# Patient Record
Sex: Male | Born: 1975 | Race: White | Hispanic: No | Marital: Married | Smoking: Never smoker
Health system: Southern US, Community
[De-identification: ages and names within clinical notes are randomized; demographics above are authoritative.]

## PROBLEM LIST (undated history)

## (undated) DIAGNOSIS — I10 Essential (primary) hypertension: Secondary | ICD-10-CM

## (undated) DIAGNOSIS — K219 Gastro-esophageal reflux disease without esophagitis: Secondary | ICD-10-CM

## (undated) DIAGNOSIS — J302 Other seasonal allergic rhinitis: Secondary | ICD-10-CM

## (undated) DIAGNOSIS — Z87442 Personal history of urinary calculi: Secondary | ICD-10-CM

## (undated) DIAGNOSIS — G4730 Sleep apnea, unspecified: Secondary | ICD-10-CM

## (undated) HISTORY — PX: CHOLECYSTECTOMY: SHX55

## (undated) HISTORY — DX: Essential (primary) hypertension: I10

---

## 2010-07-10 ENCOUNTER — Encounter
Admission: RE | Admit: 2010-07-10 | Discharge: 2010-07-10 | Payer: Self-pay | Source: Home / Self Care | Attending: Orthopedic Surgery | Admitting: Orthopedic Surgery

## 2011-06-27 IMAGING — RF DG FLUORO GUIDE NDL PLC/BX
9 series · 9 of 9 positions shown · IV contrast (multihance)
Comparison: none

CLINICAL DATA: Pain.  Assess for scapholunate ligament tear.

Fluoroscopy Time: 59 seconds
LEFT RADIOCARPAL JOINT INJECTION UNDER FLUOROSCOPY
TECHNIQUE: The skin overlying the left dorsal wrist was cleansed
with Betadine, draped in the usual sterile fashion, and infiltrated
locally with 1% Lidocaine.  A 25 gauge needle was advanced to the
joint on one pass under intermittent fluoroscopy.    Threeml of a
mixture of 0.1 ml Multihance 20 ml of dilute Maruchy 60 was then
used to fill the radiocarpal joint.  No apparent complication.  The
patient was taken immediately to MR.  Limited filming shows
contrast restricted to the radiocarpal joint.

[Series 1: (hospital) · 1 of 1 slices shown (1 of 9)]
[im 1/1]
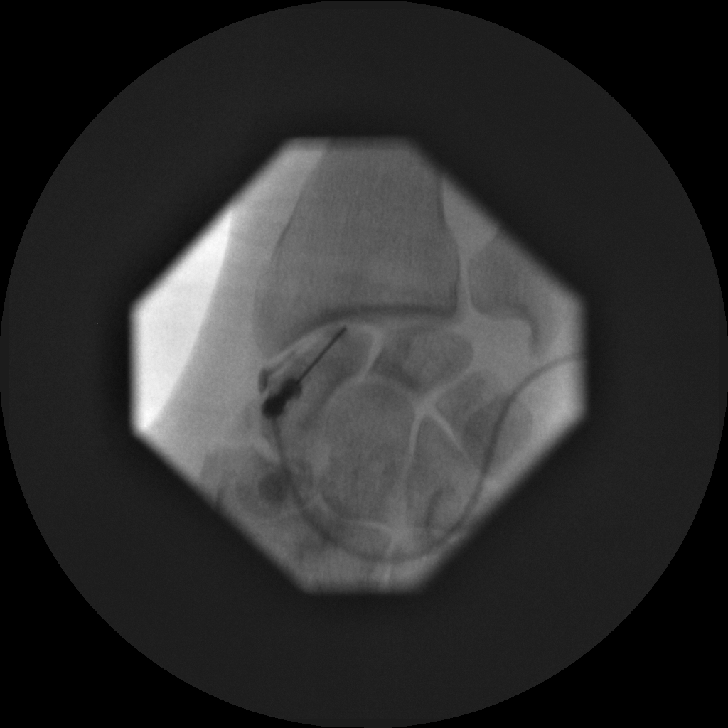

[Series 2: (hospital) · 1 of 1 slices shown (2 of 9)]
[im 1/1]
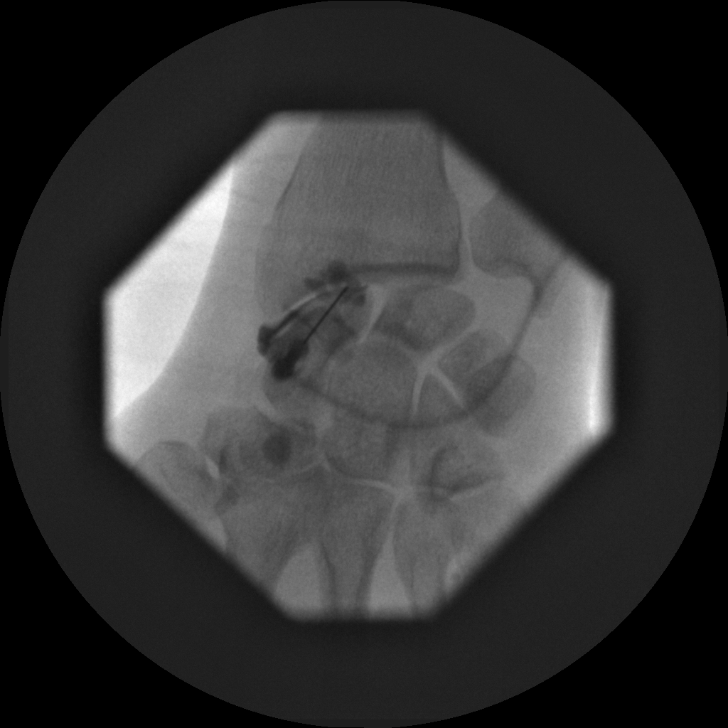

[Series 3: (hospital) · 1 of 1 slices shown (3 of 9)]
[im 1/1]
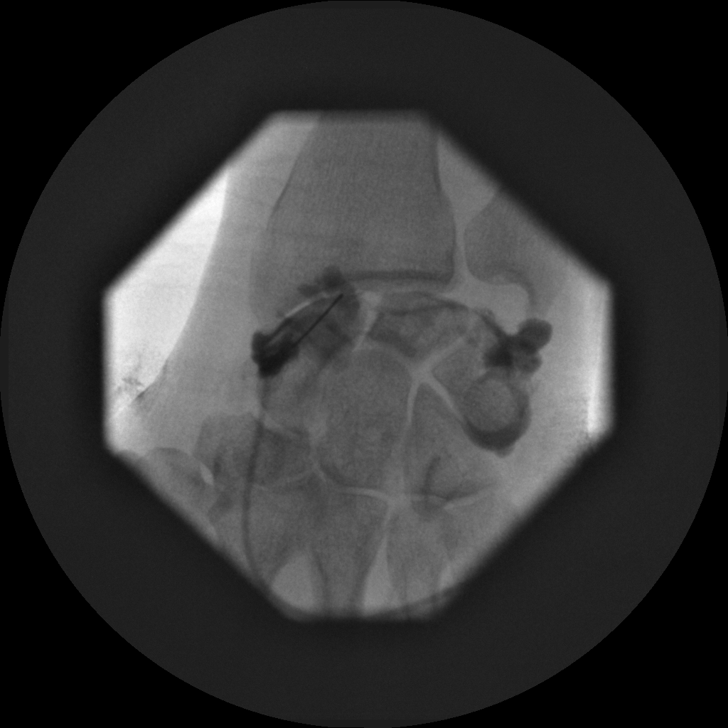

[Series 4: (hospital) · 1 of 1 slices shown (4 of 9)]
[im 1/1]
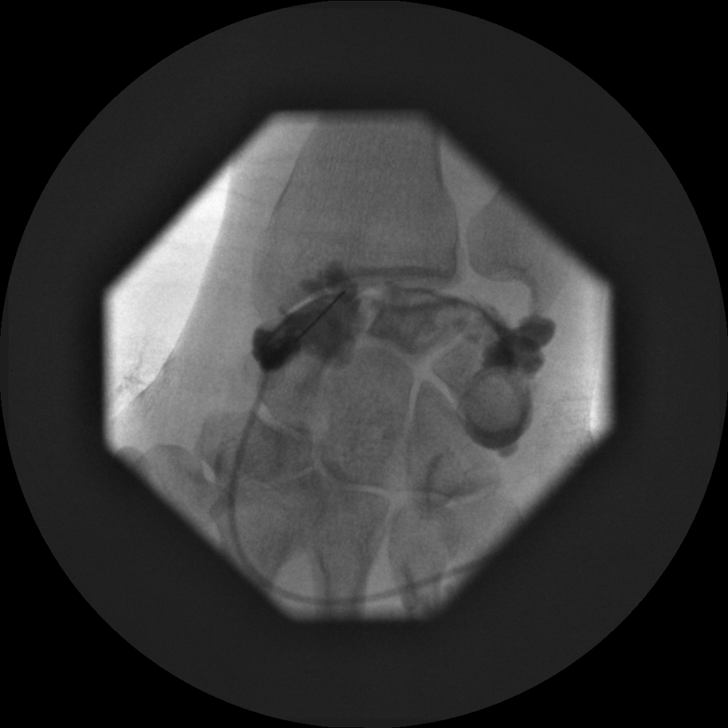

[Series 5: (hospital) · 1 of 1 slices shown (5 of 9)]
[im 1/1]
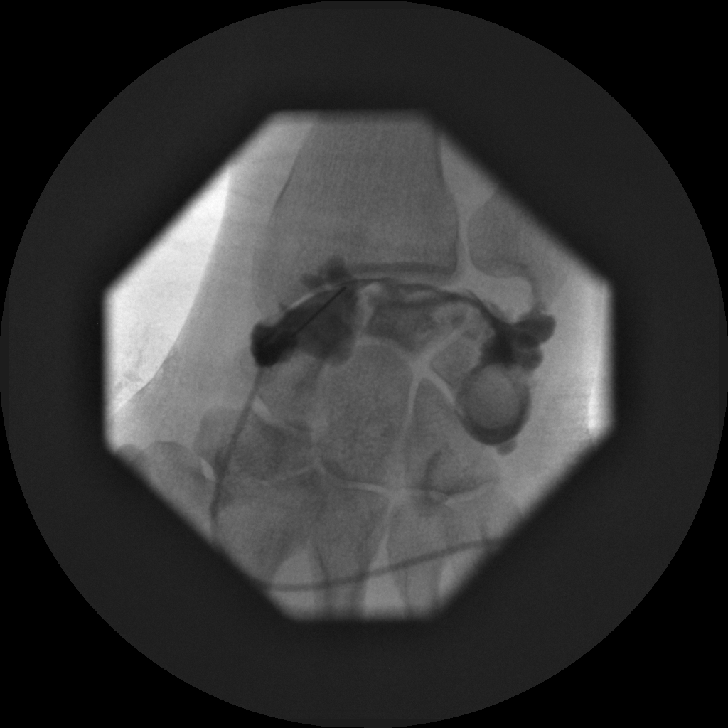

[Series 6: (hospital) · 1 of 1 slices shown (6 of 9)]
[im 1/1]
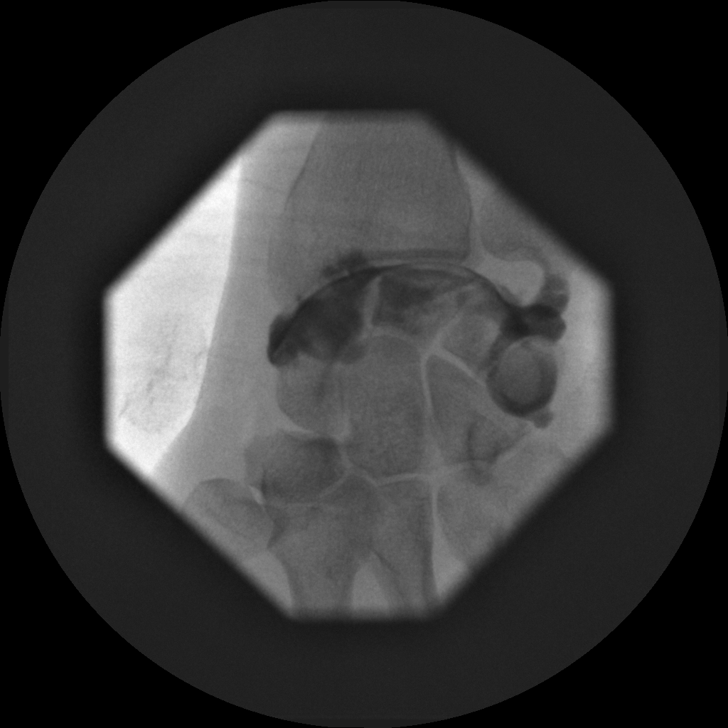

[Series 7: (hospital) · 1 of 1 slices shown (7 of 9)]
[im 1/1]
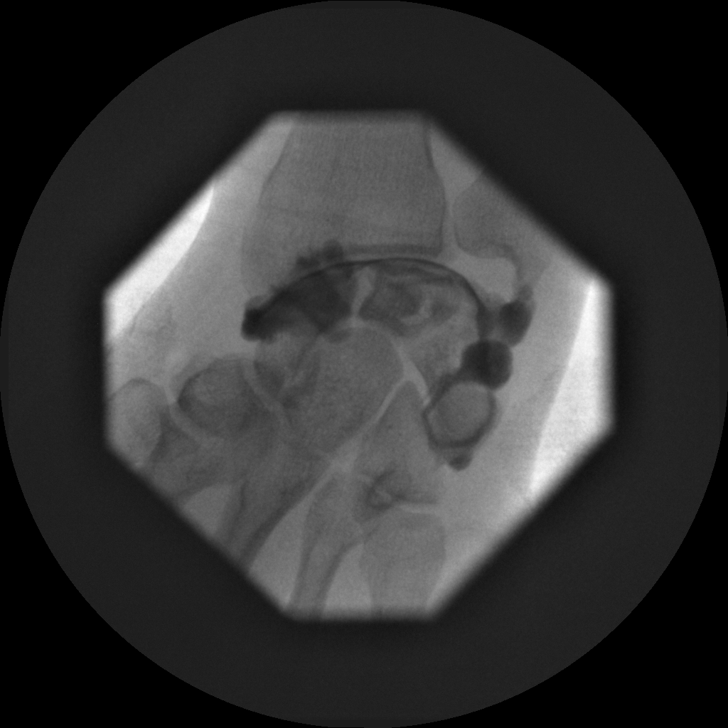

[Series 8: (hospital) · 1 of 1 slices shown (8 of 9)]
[im 1/1]
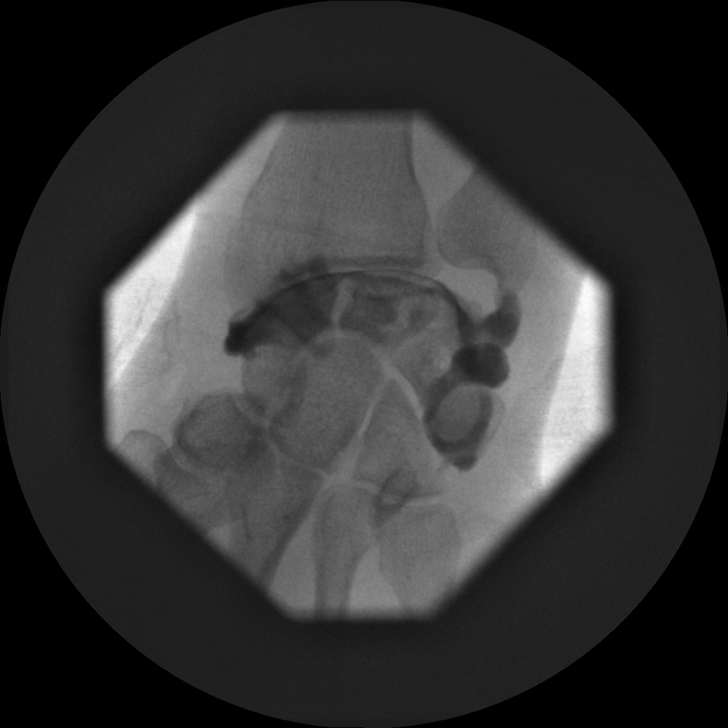

[Series 9: (hospital) · 1 of 1 slices shown (9 of 9)]
[im 1/1]
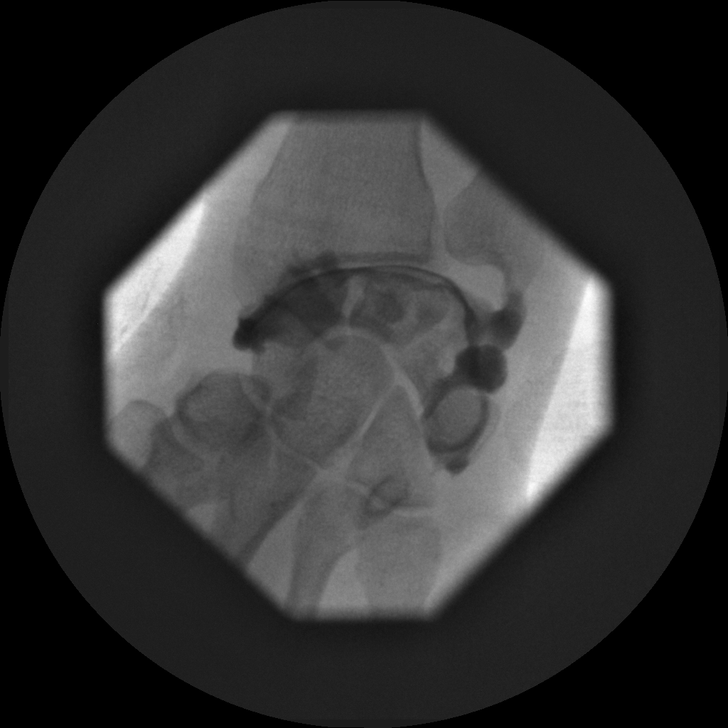

[9 of 9 positions shown; findings below may reference images not displayed]

IMPRESSION: Technically successful left radiocarpal injection for MRI.

## 2014-07-23 HISTORY — PX: CHOLECYSTECTOMY: SHX55

## 2018-06-25 ENCOUNTER — Ambulatory Visit (INDEPENDENT_AMBULATORY_CARE_PROVIDER_SITE_OTHER): Payer: 59 | Admitting: Internal Medicine

## 2018-06-25 ENCOUNTER — Encounter (INDEPENDENT_AMBULATORY_CARE_PROVIDER_SITE_OTHER): Payer: Self-pay | Admitting: Internal Medicine

## 2018-06-25 ENCOUNTER — Telehealth (INDEPENDENT_AMBULATORY_CARE_PROVIDER_SITE_OTHER): Payer: Self-pay | Admitting: *Deleted

## 2018-06-25 ENCOUNTER — Encounter (INDEPENDENT_AMBULATORY_CARE_PROVIDER_SITE_OTHER): Payer: Self-pay | Admitting: *Deleted

## 2018-06-25 VITALS — BP 154/82 | HR 84 | Temp 97.5°F | Ht 70.0 in | Wt 213.5 lb

## 2018-06-25 DIAGNOSIS — R195 Other fecal abnormalities: Secondary | ICD-10-CM

## 2018-06-25 DIAGNOSIS — R197 Diarrhea, unspecified: Secondary | ICD-10-CM

## 2018-06-25 DIAGNOSIS — I10 Essential (primary) hypertension: Secondary | ICD-10-CM | POA: Insufficient documentation

## 2018-06-25 NOTE — Telephone Encounter (Signed)
Patient needs suprep 

## 2018-06-25 NOTE — Progress Notes (Signed)
   Subjective:    Patient ID: Brendan Armstrong, male    DOB: 09-13-1975, 42 y.o.   MRN: [id number]  HPI Referred by Blima DessertJo Ann Earp FNP for change in bowel movements. He has urgency. BMs are loose to watery. No blood or mucous.  No fever.  Has tried Imodium which helped initially. He has noticed when he eats red meat he has to go to the BR before he finishes. Has not had eaten red meat since Saturday. The urgency is not quite as bad since he has had not had the urgency. He has BMs multiple times during the day. For the past 4-6 weeks, he will have 2-3 BMs before he goes to work.  He feels sick to his stomach but does not have the urge to throw up. Wife states he feels tired. He has had symptoms for about 6 weeks.  Before this he would have 2-3 stools a day and were loose but not constant. He says the majority of the time, his stools were the right consistency. No recent antibiotics. He has not removed any ticks that he know of.  No family hx of colon cancer. Wife would also like to proceed with a colonoscopy.   Ova and Para with Giardia 06/18/2018: Not detected. Shiga toxin, campylobacter, salmonella and shigella: negative.   06/11/2018 H and H 14.5 and 41.5  Works for [employer].   Review of Systems Past Medical History:  Diagnosis Date  . Hypertension       Allergies  Allergen Reactions  . Avelox [Moxifloxacin Hcl In Nacl]     Rash,hives    Current Outpatient Medications on File Prior to Visit  Medication Sig Dispense Refill  . cetirizine (ZYRTEC) 10 MG tablet Take 10 mg by mouth daily.    . Cholecalciferol (VITAMIN D3) 10 MCG (400 UNIT) CHEW Chew 1,000 Units by mouth.    Marland Kitchen. lisinopril (PRINIVIL,ZESTRIL) 20 MG tablet Take 20 mg by mouth daily. Takes 20mg  one day and then 10mg  the following day. Alternates    . montelukast (SINGULAIR) 10 MG tablet Take 10 mg by mouth at bedtime.    Marland Kitchen. omeprazole (PRILOSEC) 20 MG capsule Take 20 mg by mouth daily.    . vitamin C  (ASCORBIC ACID) 500 MG tablet Take 500 mg by mouth daily.     No current facility-administered medications on file prior to visit.         Objective:   Physical Exam Blood pressure (!) 154/82, pulse 84, temperature (!) 97.5 F (36.4 C), height 5\' 10"  (1.778 m), weight 213 lb 8 oz (96.8 kg). Alert and oriented. Skin warm and dry. Oral mucosa is moist.   . Sclera anicteric, conjunctivae is pink. Thyroid not enlarged. No cervical lymphadenopathy. Lungs clear. Heart regular rate and rhythm.  Abdomen is soft. Bowel sounds are positive. No hepatomegaly. No abdominal masses felt. No tenderness.  No edema to lower extremities.          Assessment & Plan:  Change in stool. Will set him up for a colonoscopy. Will get a GI pathogen. C diff not done on stool studies.

## 2018-06-25 NOTE — Patient Instructions (Addendum)
Colonoscopy. GI pathogen.  Imodium one in am and one on pm

## 2018-06-26 MED ORDER — SUPREP BOWEL PREP KIT 17.5-3.13-1.6 GM/177ML PO SOLN: 1.0000 | Freq: Once | ORAL | 0 refills | Status: AC

## 2018-07-02 ENCOUNTER — Telehealth (INDEPENDENT_AMBULATORY_CARE_PROVIDER_SITE_OTHER): Payer: Self-pay | Admitting: Internal Medicine

## 2018-07-02 LAB — GASTROINTESTINAL PATHOGEN PANEL PCR
C. DIFFICILE TOX A/B, PCR: NOT DETECTED
Campylobacter, PCR: NOT DETECTED
Cryptosporidium, PCR: NOT DETECTED
E COLI (ETEC) LT/ST, PCR: NOT DETECTED
E coli (STEC) stx1/stx2, PCR: NOT DETECTED
E coli 0157, PCR: NOT DETECTED
GIARDIA LAMBLIA, PCR: NOT DETECTED
Norovirus, PCR: NOT DETECTED
Rotavirus A, PCR: NOT DETECTED
SHIGELLA, PCR: NOT DETECTED
Salmonella, PCR: NOT DETECTED

## 2018-07-02 NOTE — Telephone Encounter (Signed)
Wife called wanted to know if you got test results back - please call [phone]

## 2018-07-02 NOTE — Telephone Encounter (Signed)
Message left on phone that stool studies are not back.

## 2018-07-17 NOTE — Telephone Encounter (Signed)
Results left on answering machine. Scheduled for a colonoscopy

## 2018-08-27 ENCOUNTER — Encounter (HOSPITAL_COMMUNITY)
Admission: RE | Disposition: A | Discharge status: Home / Self Care | Payer: Self-pay | Source: Home / Self Care | Attending: Internal Medicine

## 2018-08-27 ENCOUNTER — Other Ambulatory Visit: Payer: Self-pay

## 2018-08-27 ENCOUNTER — Encounter (HOSPITAL_COMMUNITY): Payer: Self-pay | Admitting: *Deleted

## 2018-08-27 ENCOUNTER — Ambulatory Visit (HOSPITAL_COMMUNITY)
Admission: RE | Admit: 2018-08-27 | Discharge: 2018-08-27 | Disposition: A | Discharge status: Home / Self Care | Payer: Commercial Managed Care - PPO | Attending: Internal Medicine | Admitting: Internal Medicine

## 2018-08-27 DIAGNOSIS — Z79899 Other long term (current) drug therapy: Secondary | ICD-10-CM | POA: Diagnosis not present

## 2018-08-27 DIAGNOSIS — R197 Diarrhea, unspecified: Secondary | ICD-10-CM | POA: Insufficient documentation

## 2018-08-27 DIAGNOSIS — K648 Other hemorrhoids: Secondary | ICD-10-CM

## 2018-08-27 DIAGNOSIS — I10 Essential (primary) hypertension: Secondary | ICD-10-CM | POA: Diagnosis not present

## 2018-08-27 DIAGNOSIS — K219 Gastro-esophageal reflux disease without esophagitis: Secondary | ICD-10-CM | POA: Insufficient documentation

## 2018-08-27 HISTORY — DX: Gastro-esophageal reflux disease without esophagitis: K21.9

## 2018-08-27 HISTORY — DX: Other seasonal allergic rhinitis: J30.2

## 2018-08-27 HISTORY — PX: COLONOSCOPY: SHX5424

## 2018-08-27 SURGERY — COLONOSCOPY
Anesthesia: Moderate Sedation

## 2018-08-27 MED ORDER — MEPERIDINE HCL 50 MG/ML IJ SOLN
INTRAMUSCULAR | Status: AC
  Filled 2018-08-27: qty 1

## 2018-08-27 MED ORDER — SODIUM CHLORIDE 0.9 % IV SOLN
INTRAVENOUS | Status: DC
  Administered 2018-08-27: 14:00:00 via INTRAVENOUS

## 2018-08-27 MED ORDER — MIDAZOLAM HCL 5 MG/5ML IJ SOLN
INTRAMUSCULAR | Status: AC
  Filled 2018-08-27: qty 10

## 2018-08-27 MED ORDER — MIDAZOLAM HCL 5 MG/5ML IJ SOLN
INTRAMUSCULAR | Status: DC | PRN
  Administered 2018-08-27: 1 mg via INTRAVENOUS
  Administered 2018-08-27 (×3): 2 mg via INTRAVENOUS
  Administered 2018-08-27: 1 mg via INTRAVENOUS

## 2018-08-27 MED ORDER — DICYCLOMINE HCL 10 MG PO CAPS: 10.0000 mg | ORAL_CAPSULE | Freq: Three times a day (TID) | ORAL | 5 refills | Status: DC

## 2018-08-27 MED ORDER — MEPERIDINE HCL 50 MG/ML IJ SOLN
INTRAMUSCULAR | Status: DC | PRN
  Administered 2018-08-27: 25 mg via INTRAVENOUS
  Administered 2018-08-27: 25 mg

## 2018-08-27 NOTE — OR Nursing (Signed)
To Whom it May Concern:  Brendan BandaJason Armstrong underwent a procedure on 08/27/2018 in which he was sedated.  He is unable to drive or work until 6/0/45402/01/2019.  Thank you    Jenesis Suchy B. Mathis FareAlbert, rn

## 2018-08-27 NOTE — H&P (Addendum)
Brendan Armstrong is an 43 y.o. male.   Chief Complaint: Patient is here for colonoscopy. HPI: Patient is 43 year old Caucasian male who presents with 73-month history of diarrhea with urgency.  No history of fever abdominal pain rectal bleeding or weight loss.  Stool studies were negative including GI pathogen panel.  Family history is negative for IBD or CRC.   He is here for diagnostic colonoscopy.  Past Medical History:  Diagnosis Date  . GERD (gastroesophageal reflux disease)   . Hypertension   . Seasonal allergies     Past Surgical History:  Procedure Laterality Date  . CHOLECYSTECTOMY     cholecystitis    Family History  Problem Relation Age of Onset  . Colon cancer Neg Hx    Social History:  reports that he has never smoked. He has never used smokeless tobacco. He reports that he does not drink alcohol or use drugs.  Allergies:  Allergies  Allergen Reactions  . Avelox [Moxifloxacin Hcl In Nacl] Hives and Rash    Medications Prior to Admission  Medication Sig Dispense Refill  . cholecalciferol (VITAMIN D) 25 MCG (1000 UT) tablet Take 1,000 Units by mouth daily.    Marland Kitchen ibuprofen (ADVIL,MOTRIN) 200 MG tablet Take 600 mg by mouth every 8 (eight) hours as needed (pain.).    Marland Kitchen lisinopril (PRINIVIL,ZESTRIL) 20 MG tablet Take 10-20 mg by mouth See admin instructions. Take 1 tablet (20 mg) by mouth every other day, then take 0.5 tablet (10 mg) by mouth every other day (alternating cyclically)    . montelukast (SINGULAIR) 10 MG tablet Take 10 mg by mouth daily.     Marland Kitchen omeprazole (PRILOSEC) 20 MG capsule Take 20 mg by mouth daily before breakfast.     . vitamin C (ASCORBIC ACID) 500 MG tablet Take 500 mg by mouth daily.      No results found for this or any previous visit (from the past 48 hour(s)). No results found.  ROS  Blood pressure 119/85, pulse 99, temperature 98.2 F (36.8 C), temperature source Oral, resp. rate 14, height 5\' 10"  (1.778 m), weight 93 kg, SpO2 98 %. Physical  Exam  Constitutional: He appears well-developed and well-nourished.  HENT:  Mouth/Throat: Oropharynx is clear and moist.  Eyes: Conjunctivae are normal. No scleral icterus.  Neck: No thyromegaly present.  Cardiovascular: Normal rate, regular rhythm and normal heart sounds.  No murmur heard. Respiratory: Effort normal and breath sounds normal.  GI: Soft. He exhibits no distension and no mass. There is no abdominal tenderness.  Musculoskeletal:        General: No edema.  Neurological: He is alert.  Skin: Skin is warm and dry.     Assessment/Plan 25-month history of diarrhea and negative stool studies. Diagnostic colonoscopy.  Lionel December, MD 08/27/2018, 2:21 PM

## 2018-08-27 NOTE — Discharge Instructions (Signed)
No aspirin or NSAIDs for 24 hours. Resume usual medications as before. Dicyclomine 10 mg by mouth 30 minutes before each meal daily. Resume usual diet. No driving for 24 hours. Physician will call with biopsy results and further recommendations.   Colonoscopy, Adult, Care After This sheet gives you information about how to care for yourself after your procedure. Your health care provider may also give you more specific instructions. If you have problems or questions, contact your health care provider. Dr Karilyn Cota:  [phone].  After hours and weekends call the hospital and have the GI doctor on call paged; they will call you back.  What can I expect after the procedure? After the procedure, it is common to have:  A small amount of blood in your stool for 24 hours after the procedure.  Some gas.  Mild abdominal cramping or bloating. Follow these instructions at home: General instructions  For the first 24 hours after the procedure: ? Do not drive or use machinery. ? Do not sign important documents. ? Do not drink alcohol. ? Do your regular daily activities at a slower pace than normal.  Take over-the-counter or prescription medicines only as told by your health care provider. Relieving cramping and bloating   Try walking around when you have cramps or feel bloated.  Eating and drinking   Drink enough fluid to keep your urine pale yellow.  Resume your normal diet as instructed by your health care provider. Contact a health care provider if:  You have blood in your stool 2-3 days after the procedure. Get help right away if:  You have more than a small spotting of blood in your stool.  You pass large blood clots in your stool.  Your abdomen is swollen.  You have nausea or vomiting.  You have a fever.  You have increasing abdominal pain that is not relieved with medicine. Summary  After the procedure, it is common to have a small amount of blood in your stool.  You may also have mild abdominal cramping and bloating.  For the first 24 hours after the procedure, do not drive or use machinery, sign important documents, or drink alcohol.  Contact your health care provider if you have a lot of blood in your stool, nausea or vomiting, a fever, or increased abdominal pain. This information is not intended to replace advice given to you by your health care provider. Make sure you discuss any questions you have with your health care provider. Document Released: 02/21/2004 Document Revised: 05/01/2017 Document Reviewed: 09/20/2015 Elsevier Interactive Patient Education  2019 [employer].

## 2018-08-27 NOTE — Op Note (Signed)
[name] Patient Name: Brendan BandaJason Armstrong Procedure Date: 08/27/2018 2:01 PM MRN: [id number] Date of Birth: Nov 28, 1975 Attending MD: Lionel DecemberNajeeb Chett Taniguchi , MD CSN: [id number] Age: 43 Admit Type: Outpatient Procedure:                Colonoscopy Indications:              Clinically significant diarrhea of unexplained                            origin Providers:                Lionel DecemberNajeeb Purity Irmen, MD, Jannett CelestineAnitra Bell, RN, Dyann Ruddleonya Wilson Referring MD:             Blima DessertJo Ann Earp, FNP Medicines:                Meperidine 50 mg IV, Midazolam 8 mg IV Complications:            No immediate complications. Estimated Blood Loss:     Estimated blood loss was minimal. Procedure:                Pre-Anesthesia Assessment:                           - Prior to the procedure, a History and Physical                            was performed, and patient medications and                            allergies were reviewed. The patient's tolerance of                            previous anesthesia was also reviewed. The risks                            and benefits of the procedure and the sedation                            options and risks were discussed with the patient.                            All questions were answered, and informed consent                            was obtained. Prior Anticoagulants: The patient has                            taken no previous anticoagulant or antiplatelet                            agents. ASA Grade Assessment: II - A patient with                            mild systemic disease. After reviewing the risks  and benefits, the patient was deemed in                            satisfactory condition to undergo the procedure.                           After obtaining informed consent, the colonoscope                            was passed under direct vision. Throughout the                            procedure, the patient's blood pressure, pulse, and                             oxygen saturations were monitored continuously. The                            CF-[id number] ([id number]) scope was introduced through                            the anus and advanced to the the cecum, identified                            by appendiceal orifice and ileocecal valve. The                            colonoscopy was performed without difficulty. The                            patient tolerated the procedure well. The quality                            of the bowel preparation was good. The ileocecal                            valve, appendiceal orifice, and rectum were                            photographed. Scope In: 2:31:36 PM Scope Out: 2:50:00 PM Scope Withdrawal Time: 0 hours 9 minutes 42 seconds  Total Procedure Duration: 0 hours 18 minutes 24 seconds  Findings:      The perianal and digital rectal examinations were normal.      The colon (entire examined portion) appeared normal. Random biopsies       were taken from sigmoid with a cold forceps for histology.      Internal hemorrhoids were found during retroflexion. The hemorrhoids       were small. Impression:               - The entire examined colon is normal. Biopsied.                           - Internal hemorrhoids. Moderate Sedation:      Moderate (conscious) sedation was administered by the endoscopy  nurse       and supervised by the endoscopist. The following parameters were       monitored: oxygen saturation, heart rate, blood pressure, CO2       capnography and response to care. Total physician intraservice time was       25 minutes. Recommendation:           - Patient has a contact number available for                            emergencies. The signs and symptoms of potential                            delayed complications were discussed with the                            patient. Return to normal activities tomorrow.                            Written discharge instructions were provided to  the                            patient.                           - Resume previous diet today.                           - Continue present medications.                           - Use Bentyl (dicyclomine) 10 mg PO TID 30 min AC.                           - Await pathology results.                           - Repeat colonoscopy at age 43 for screening                            purposes. Procedure Code(s):        --- Professional ---                           [id number], Colonoscopy, flexible; with biopsy, single                            or multiple                           99153, Moderate sedation; each additional 15                            minutes intraservice time                           G0500, Moderate sedation services provided by the  same physician or other qualified health care                            professional performing a gastrointestinal                            endoscopic service that sedation supports,                            requiring the presence of an independent trained                            observer to assist in the monitoring of the                            patient's level of consciousness and physiological                            status; initial 15 minutes of intra-service time;                            patient age 50 years or older (additional time may                            be reported with 16109, as appropriate) Diagnosis Code(s):        --- Professional ---                           K64.8, Other hemorrhoids                           R19.7, Diarrhea, unspecified CPT copyright 2018 American Medical Association. All rights reserved. The codes documented in this report are preliminary and upon coder review may  be revised to meet current compliance requirements. Lionel December, MD Lionel December, MD 08/27/2018 3:00:23 PM This report has been signed electronically. Number of Addenda: 0

## 2018-09-03 ENCOUNTER — Encounter (HOSPITAL_COMMUNITY): Payer: Self-pay | Admitting: Internal Medicine

## 2019-03-02 ENCOUNTER — Telehealth (INDEPENDENT_AMBULATORY_CARE_PROVIDER_SITE_OTHER): Payer: Self-pay | Admitting: Internal Medicine

## 2019-03-02 NOTE — Telephone Encounter (Signed)
Patients wife called stated Dr Laural Golden had prescribed a medication but it is not working - wants to know if there is anything else he can try - ph# for Claiborne Billings (spouse) [phone]

## 2019-03-04 NOTE — Telephone Encounter (Signed)
Talked with Claiborne Billings , patient's wife. She is asking if there is another medication that he can take instead of the Dicyclomine. His life style is so busy the patient forgets to take it before meals.  He was able to take it at first as prescribed , and told wife he could not tell a difference in his stomach. Wife says he can't make it unless he takes Imodium. His diarrhea is so bad after he eats. Patient has got to where he is afraid to eat.  She says if he needs to come back in for another visit to just let her know.  [phone].

## 2019-03-09 NOTE — Telephone Encounter (Signed)
Forwarded to Dr.Rehman. 

## 2019-03-11 NOTE — Telephone Encounter (Signed)
Patients wife called back wanting to know about patients medication - ph# [phone]

## 2019-03-11 NOTE — Telephone Encounter (Signed)
I have sent Dr.Rehman a message.

## 2019-03-11 NOTE — Telephone Encounter (Signed)
Per Dr.Rehman  Patient is to stop the Dicyclomine. He may take the Imodium 2 mg by mouth 3 times daily. We need to get a Celiac Antibody Panel and ask the patient to keep a stool diary for 2 weeks. He will keep a note as if the stool was liquid and small , liquid and large , ect. Describe it as it appears each time. He will be given a 2 week appointment and bring the stool diary in with him.  Patient was called and a message was left with Dr.Rehman's recommendation. Ask Claiborne Billings to call with the lab order should go to in the morning.

## 2019-03-11 NOTE — Telephone Encounter (Signed)
addressed

## 2019-03-16 ENCOUNTER — Other Ambulatory Visit (INDEPENDENT_AMBULATORY_CARE_PROVIDER_SITE_OTHER): Payer: Self-pay | Admitting: *Deleted

## 2019-03-16 DIAGNOSIS — R197 Diarrhea, unspecified: Secondary | ICD-10-CM

## 2019-03-23 ENCOUNTER — Telehealth (INDEPENDENT_AMBULATORY_CARE_PROVIDER_SITE_OTHER): Payer: Self-pay | Admitting: Internal Medicine

## 2019-03-23 NOTE — Telephone Encounter (Signed)
Celiac antibody panel is negative. Patient called and message last results left on his answering service. I asked patient to call office with progress report. If he is still having diarrhea would arrange for office visit.

## 2021-05-05 ENCOUNTER — Ambulatory Visit: Payer: 59 | Admitting: Cardiovascular Disease

## 2021-06-20 ENCOUNTER — Ambulatory Visit: Payer: 59 | Admitting: Cardiovascular Disease

## 2021-07-13 ENCOUNTER — Other Ambulatory Visit: Payer: Self-pay

## 2021-07-13 ENCOUNTER — Ambulatory Visit (INDEPENDENT_AMBULATORY_CARE_PROVIDER_SITE_OTHER): Payer: BC Managed Care – PPO | Admitting: Cardiovascular Disease

## 2021-07-13 ENCOUNTER — Encounter: Payer: Self-pay | Admitting: Cardiovascular Disease

## 2021-07-13 ENCOUNTER — Ambulatory Visit (INDEPENDENT_AMBULATORY_CARE_PROVIDER_SITE_OTHER): Payer: 59

## 2021-07-13 VITALS — BP 144/91 | HR 89 | Ht 70.0 in | Wt 220.0 lb

## 2021-07-13 DIAGNOSIS — I10 Essential (primary) hypertension: Secondary | ICD-10-CM

## 2021-07-13 DIAGNOSIS — R002 Palpitations: Secondary | ICD-10-CM

## 2021-07-13 MED ORDER — LISINOPRIL 20 MG PO TABS: 20.0000 mg | ORAL_TABLET | Freq: Every day | ORAL | 2 refills | Status: DC

## 2021-07-13 NOTE — Patient Instructions (Signed)
Medication Instructions:  Your physician has recommended you make the following change in your medication:   INCREASE Lisinopril to 20 mg daily. An Rx has been sent to your pharmacy.   *If you need a refill on your cardiac medications before your next appointment, please call your pharmacy*   Lab Work: None ordered  If you have labs (blood work) drawn today and your tests are completely normal, you will receive your results only by: MyChart Message (if you have MyChart) OR A paper copy in the mail If you have any lab test that is abnormal or we need to change your treatment, we will call you to review the results.   Testing/Procedures: Your provider has ordered a heart monitor to wear for 14 days. This will be mailed to your home with instructions on placement. Once you have finished the time frame requested, you will return monitor in box provided.      Follow-Up: At [facility], you and your health needs are our priority.  As part of our continuing mission to provide you with exceptional heart care, we have created designated Provider Care Teams.  These Care Teams include your primary Cardiologist (physician) and Advanced Practice Providers (APPs -  Physician Assistants and Nurse Practitioners) who all work together to provide you with the care you need, when you need it.  We recommend signing up for the patient portal called "MyChart".  Sign up information is provided on this After Visit Summary.  MyChart is used to connect with patients for Virtual Visits (Telemedicine).  Patients are able to view lab/test results, encounter notes, upcoming appointments, etc.  Non-urgent messages can be sent to your provider as well.   To learn more about what you can do with MyChart, go to [email or web address].    Your next appointment:   As needed  The format for your next appointment:   In Person  Provider:   You may see Lorine Bears, MD or one of the following Advanced Practice  Providers on your designated Care Team:   Nicolasa Ducking, NP Eula Listen, PA-C Cadence Fransico Michael, PA-C{   Other Instructions N/A

## 2021-07-13 NOTE — Progress Notes (Signed)
Cardiology Office Note   Date:  07/13/2021   ID:  Brendan Armstrong, DOB Apr 22, 1976, MRN [id number]  PCP:  Blima Dessert, FNP  Cardiologist:   Lorine Bears, MD   Chief Complaint  Patient presents with   Other    BP c/o dizziness, edema hands, feet and ankles. Meds reviewed verbally with pt.      History of Present Illness: Brendan Armstrong is a 45 y.o. male who was referred by Blima Dessert for evaluation of dizziness and palpitations.  The patient has no past cardiac history.  He has known history of essential hypertension GERD and obesity.  He was diagnosed with hypertension more than 10 years ago and was started on small dose lisinopril with reasonable control.  He is not a smoker and does not drink alcohol.  He does admit excessive amount of caffeine.  There is family history of heart disease but the specifics are not available. Recently, he had intermittent swelling in his hands but that has resolved.  In addition, he does complain of swelling in his feet at the end of a long working day that usually resolves by the next morning.  He had issues with orthostatic dizziness and intermittent palpitations associated with dizziness.  No chest pain or shortness of breath. He does snore at night with episodes of apnea noted by his wife.  He will be tested for sleep apnea in the near future.  During episodes of dizziness, his blood pressure is usually elevated.   Past Medical History:  Diagnosis Date   GERD (gastroesophageal reflux disease)    Hypertension    Seasonal allergies     Past Surgical History:  Procedure Laterality Date   CHOLECYSTECTOMY     cholecystitis   COLONOSCOPY N/A 08/27/2018   Procedure: COLONOSCOPY;  Surgeon: Malissa Hippo, MD;  Location: [facility];  Service: Endoscopy;  Laterality: N/A;  2:40     Current Outpatient Medications  Medication Sig Dispense Refill   cholecalciferol (VITAMIN D) 25 MCG (1000 UT) tablet Take 1,000 Units by mouth daily.      ibuprofen (ADVIL,MOTRIN) 200 MG tablet Take 600 mg by mouth every 8 (eight) hours as needed (pain.).     lisinopril (PRINIVIL,ZESTRIL) 20 MG tablet Take 10-20 mg by mouth See admin instructions. Take 1 tablet (20 mg) by mouth every other day, then take 0.5 tablet (10 mg) by mouth every other day (alternating cyclically)     loratadine (CLARITIN) 10 MG tablet Take 10 mg by mouth daily.     omeprazole (PRILOSEC) 20 MG capsule Take 20 mg by mouth daily before breakfast.      vitamin C (ASCORBIC ACID) 500 MG tablet Take 500 mg by mouth daily.     No current facility-administered medications for this visit.    Allergies:   Avelox [moxifloxacin hcl in nacl]    Social History:  The patient  reports that he has never smoked. He has never used smokeless tobacco. He reports that he does not drink alcohol and does not use drugs.   Family History:  The patient's family history includes Heart Problems in his father and paternal grandfather.    ROS:  Please see the history of present illness.   Otherwise, review of systems are positive for none.   All other systems are reviewed and negative.    PHYSICAL EXAM: VS:  BP (!) 144/91 (BP Location: Right Arm, Patient Position: Sitting, Cuff Size: Large)    Ht 5\' 10"  (1.778  m)    Wt 220 lb (99.8 kg)    BMI 31.57 kg/m  , BMI Body mass index is 31.57 kg/m. GEN: Well nourished, well developed, in no acute distress  HEENT: normal  Neck: no JVD, carotid bruits, or masses Cardiac: RRR; no murmurs, rubs, or gallops,no edema  Respiratory:  clear to auscultation bilaterally, normal work of breathing GI: soft, nontender, nondistended, + BS MS: no deformity or atrophy  Skin: warm and dry, no rash Neuro:  Strength and sensation are intact Psych: euthymic mood, full affect   EKG:  EKG is ordered today. The ekg ordered today demonstrates normal sinus rhythm with no significant ST or T wave changes.   Recent Labs: No results found for requested labs within last  8760 hours.    Lipid Panel No results found for: CHOL, TRIG, HDL, CHOLHDL, VLDL, LDLCALC, LDLDIRECT    Wt Readings from Last 3 Encounters:  07/13/21 220 lb (99.8 kg)  08/27/18 205 lb (93 kg)  06/25/18 213 lb 8 oz (96.8 kg)      PAD Screen 07/13/2021  Previous PAD dx? No  Previous surgical procedure? Yes  Pain with walking? No  Feet/toe relief with dangling? No  Painful, non-healing ulcers? No  Extremities discolored? No      ASSESSMENT AND PLAN:  1.  Dizziness: Some of her symptoms are suggestive of orthostatic hypotension.  He is not orthostatic by blood pressure today but his heart rate did go up from 90 to 106 bpm.  He does complain of intermittent palpitations and thus I requested a 2-week ZIO monitor.  2.  Essential hypertension: His blood pressure has been on the high side overall and thus I elected to increase lisinopril to 20 mg daily.  3.  Leg edema: No significant edema is noted today and I suspect some of his symptoms are related to mild chronic venous insufficiency.  He was reassured.  He has no signs of congestive heart failure.  4.  Snoring: I agree that some of his symptoms are highly suggestive of sleep apnea which might be causing fluctuations in his blood pressure..    Disposition:   FU with me as needed  Signed,  Lorine Bears, MD  07/13/2021 10:56 AM    [facility]

## 2021-07-17 DIAGNOSIS — R002 Palpitations: Secondary | ICD-10-CM | POA: Diagnosis not present

## 2021-08-08 ENCOUNTER — Telehealth: Payer: Self-pay

## 2021-08-08 NOTE — Telephone Encounter (Signed)
-----   Message from [place] Ouch, MD sent at 08/04/2021  5:19 PM EST ----- Inform patient that monitor showed no significant arrhythmia overall.  He did have some episodes of sinus tachycardia which is likely physiologic.  At some point, his heart rate was going 186 bpm that was on December 30 at 12:23 PM.  I suspect that he likely was exercising more doing something physical then.

## 2021-08-08 NOTE — Telephone Encounter (Signed)
Called to give the patient monitor results. Lmtcb. ?

## 2021-08-22 NOTE — Telephone Encounter (Signed)
Lamar Laundry, RN  08/18/2021  2:03 PM EST     Patient made aware of zio results with verbalized understanding.  Results also sent to pcp via [facility] fax.

## 2023-07-18 ENCOUNTER — Emergency Department (HOSPITAL_COMMUNITY): Payer: BC Managed Care – PPO

## 2023-07-18 ENCOUNTER — Other Ambulatory Visit: Payer: Self-pay

## 2023-07-18 ENCOUNTER — Encounter (HOSPITAL_COMMUNITY): Payer: Self-pay

## 2023-07-18 ENCOUNTER — Emergency Department (HOSPITAL_COMMUNITY)
Admission: EM | Admit: 2023-07-18 | Discharge: 2023-07-18 | Disposition: A | Discharge status: Home / Self Care | Payer: BC Managed Care – PPO | Attending: Emergency Medicine | Admitting: Emergency Medicine

## 2023-07-18 DIAGNOSIS — R1084 Generalized abdominal pain: Secondary | ICD-10-CM | POA: Insufficient documentation

## 2023-07-18 DIAGNOSIS — R197 Diarrhea, unspecified: Secondary | ICD-10-CM | POA: Insufficient documentation

## 2023-07-18 DIAGNOSIS — R112 Nausea with vomiting, unspecified: Secondary | ICD-10-CM | POA: Diagnosis not present

## 2023-07-18 LAB — CBC
HCT: 50.8 % (ref 39.0–52.0)
Hemoglobin: 17.7 g/dL — ABNORMAL HIGH (ref 13.0–17.0)
MCH: 31.9 pg (ref 26.0–34.0)
MCHC: 34.8 g/dL (ref 30.0–36.0)
MCV: 91.7 fL (ref 80.0–100.0)
Platelets: 291 10*3/uL (ref 150–400)
RBC: 5.54 MIL/uL (ref 4.22–5.81)
RDW: 11.6 % (ref 11.5–15.5)
WBC: 10.2 10*3/uL (ref 4.0–10.5)
nRBC: 0 % (ref 0.0–0.2)

## 2023-07-18 LAB — LIPASE, BLOOD: Lipase: 26 U/L (ref 11–51)

## 2023-07-18 LAB — COMPREHENSIVE METABOLIC PANEL
ALT: 36 U/L (ref 0–44)
AST: 24 U/L (ref 15–41)
Albumin: 4.2 g/dL (ref 3.5–5.0)
Alkaline Phosphatase: 72 U/L (ref 38–126)
Anion gap: 12 (ref 5–15)
BUN: 17 mg/dL (ref 6–20)
CO2: 25 mmol/L (ref 22–32)
Calcium: 9.3 mg/dL (ref 8.9–10.3)
Chloride: 99 mmol/L (ref 98–111)
Creatinine, Ser: 1 mg/dL (ref 0.61–1.24)
GFR, Estimated: >60 mL/min (ref >60)
Glucose, Bld: 111 mg/dL — ABNORMAL HIGH (ref 70–99)
Potassium: 3.6 mmol/L (ref 3.5–5.1)
Sodium: 136 mmol/L (ref 135–145)
Total Bilirubin: 0.7 mg/dL (ref <1.2)
Total Protein: 7.8 g/dL (ref 6.5–8.1)

## 2023-07-18 LAB — URINALYSIS, ROUTINE W REFLEX MICROSCOPIC
Bilirubin Urine: NEGATIVE
Glucose, UA: NEGATIVE mg/dL
Hgb urine dipstick: NEGATIVE
Ketones, ur: NEGATIVE mg/dL
Leukocytes,Ua: NEGATIVE
Nitrite: NEGATIVE
Protein, ur: NEGATIVE mg/dL
Specific Gravity, Urine: >1.046 — ABNORMAL HIGH (ref 1.005–1.030)
pH: 5 (ref 5.0–8.0)

## 2023-07-18 MED ORDER — ONDANSETRON HCL 4 MG PO TABS: 4.0000 mg | ORAL_TABLET | Freq: Three times a day (TID) | ORAL | 0 refills | Status: DC | PRN

## 2023-07-18 MED ORDER — ONDANSETRON HCL 4 MG/2ML IJ SOLN
4.0000 mg | Freq: Once | INTRAMUSCULAR | Status: AC
  Administered 2023-07-18: 4 mg via INTRAVENOUS
  Filled 2023-07-18: qty 2

## 2023-07-18 MED ORDER — IOHEXOL 300 MG/ML  SOLN
100.0000 mL | Freq: Once | INTRAMUSCULAR | Status: AC | PRN
  Administered 2023-07-18: 100 mL via INTRAVENOUS

## 2023-07-18 MED ORDER — SODIUM CHLORIDE 0.9 % IV BOLUS
1000.0000 mL | Freq: Once | INTRAVENOUS | Status: AC
  Administered 2023-07-18: 1000 mL via INTRAVENOUS

## 2023-07-18 NOTE — ED Triage Notes (Signed)
Pt with nausea, diarrhea and excessive gas since 12/24.

## 2023-07-18 NOTE — Discharge Instructions (Signed)
Liquid diet, advance as tolerated.  Take the Imodium and Zofran as needed.  Stool studies should result in the next day or 2.

## 2023-07-18 NOTE — ED Provider Notes (Signed)
[place] EMERGENCY DEPARTMENT AT [facility] Provider Note   CSN: [id number] Arrival date & time: 07/18/23  1725     History  Chief Complaint  Patient presents with   Diarrhea    Brendan Armstrong is a 47 y.o. male.  He is here with a complaint of 3 days of abdominal pain diarrhea nausea.  Started Christmas Eve today with multiple episodes of loose stool and some generalized abdominal pain.  Was a little bit better yesterday but recurred again overnight.  York Spaniel he is having mostly just watery diarrhea.  Generalized abdominal pain.  Has vomited once.  No sick contacts or recent travel.  Did eat some sausage from a local farmer but other family members are not sick.  No recent antibiotics.  No fever.  No tobacco or alcohol use  The history is provided by the patient.  Diarrhea Quality:  Watery Severity:  Moderate Onset quality:  Sudden Duration:  3 days Timing:  Intermittent Progression:  Unchanged Relieved by:  None tried Worsened by:  Nothing Ineffective treatments:  None tried Associated symptoms: abdominal pain and vomiting   Associated symptoms: no recent cough, no diaphoresis, no fever, no headaches and no URI   Risk factors: no recent antibiotic use, no sick contacts, no suspicious food intake and no travel to endemic areas        Home Medications Prior to Admission medications   Medication Sig Start Date End Date Taking? Authorizing Provider  cholecalciferol (VITAMIN D) 25 MCG (1000 UT) tablet Take 1,000 Units by mouth daily.    [provider]  ibuprofen (ADVIL,MOTRIN) 200 MG tablet Take 600 mg by mouth every 8 (eight) hours as needed (pain.).    [provider]  lisinopril (ZESTRIL) 20 MG tablet Take 1 tablet (20 mg total) by mouth daily. 07/13/21   Iran Ouch, MD  loratadine (CLARITIN) 10 MG tablet Take 10 mg by mouth daily.    [provider]  omeprazole (PRILOSEC) 20 MG capsule Take 20 mg by mouth daily before breakfast.      [provider]  vitamin C (ASCORBIC ACID) 500 MG tablet Take 500 mg by mouth daily.    [provider]      Allergies    Avelox [moxifloxacin hcl in nacl]    Review of Systems   Review of Systems  Constitutional:  Negative for diaphoresis and fever.  Respiratory:  Negative for cough.   Cardiovascular:  Negative for chest pain.  Gastrointestinal:  Positive for abdominal pain, diarrhea and vomiting.  Genitourinary:  Negative for dysuria.  Neurological:  Negative for headaches.    Physical Exam Updated Vital Signs BP (!) 139/98 (BP Location: Right Arm)   Pulse (!) 124   Temp 98.5 F (36.9 C) (Oral)   Resp 16   Ht 5\' 10"  (1.778 m)   Wt 104.3 kg   SpO2 96%   BMI 33.00 kg/m  Physical Exam Vitals and nursing note reviewed.  Constitutional:      General: He is not in acute distress.    Appearance: Normal appearance. He is well-developed.  HENT:     Head: Normocephalic and atraumatic.  Eyes:     Conjunctiva/sclera: Conjunctivae normal.  Cardiovascular:     Rate and Rhythm: Regular rhythm. Tachycardia present.     Heart sounds: No murmur heard. Pulmonary:     Effort: Pulmonary effort is normal. No respiratory distress.     Breath sounds: Normal breath sounds.  Abdominal:  Palpations: Abdomen is soft.     Tenderness: There is abdominal tenderness (Generalized). There is no guarding or rebound.  Musculoskeletal:        General: No deformity.     Cervical back: Neck supple.  Skin:    General: Skin is warm and dry.     Capillary Refill: Capillary refill takes less than 2 seconds.  Neurological:     General: No focal deficit present.     Mental Status: He is alert.     ED Results / Procedures / Treatments   Labs (all labs ordered are listed, but only abnormal results are displayed) Labs Reviewed  COMPREHENSIVE METABOLIC PANEL - Abnormal; Notable for the following components:      Result Value   Glucose, Bld 111 (*)    All other components  within normal limits  CBC - Abnormal; Notable for the following components:   Hemoglobin 17.7 (*)    All other components within normal limits  URINALYSIS, ROUTINE W REFLEX MICROSCOPIC - Abnormal; Notable for the following components:   Specific Gravity, Urine >1.046 (*)    All other components within normal limits  GASTROINTESTINAL PANEL BY PCR, STOOL (REPLACES STOOL CULTURE)  LIPASE, BLOOD    EKG None  Radiology CT ABDOMEN PELVIS W CONTRAST Result Date: 07/18/2023 CLINICAL DATA:  Several day history of nausea, diarrhea, and increased gas EXAM: CT ABDOMEN AND PELVIS WITH CONTRAST TECHNIQUE: Multidetector CT imaging of the abdomen and pelvis was performed using the standard protocol following bolus administration of intravenous contrast. RADIATION DOSE REDUCTION: This exam was performed according to the departmental dose-optimization program which includes automated exposure control, adjustment of the mA and/or kV according to patient size and/or use of iterative reconstruction technique. CONTRAST:  OMNIPAQUE IOHEXOL 300 MG/ML  SOLN COMPARISON:  None Available. FINDINGS: Lower chest: No focal consolidation or pulmonary nodule in the lung bases. No pleural effusion or pneumothorax demonstrated. Partially imaged heart size is normal. Hepatobiliary: Geographic hypoattenuation of segment 4. no intra or extrahepatic biliary ductal dilation. Normal gallbladder. Pancreas: No focal lesions or main ductal dilation. Spleen: Normal in size without focal abnormality. Adrenals/Urinary Tract: No adrenal nodules. No suspicious renal mass, calculi or hydronephrosis. No focal bladder wall thickening. Stomach/Bowel: Normal appearance of the stomach. No evidence of bowel wall thickening, distention, or inflammatory changes. Colonic diverticulosis without acute diverticulitis. Normal appendix. Vascular/Lymphatic: Aortic atherosclerosis. No enlarged abdominal or pelvic lymph nodes. Reproductive: Prostate is  unremarkable. Other: No free fluid, fluid collection, or free air. Musculoskeletal: No acute or abnormal lytic or blastic osseous lesions. Small fat-containing paraumbilical hernia. IMPRESSION: 1. No acute abdominopelvic findings. 2. Geographic hypoattenuation of segment 4 of the liver which may represent focal fatty infiltration. 3. Colonic diverticulosis without acute diverticulitis. 4. Small fat-containing paraumbilical hernia. 5.  Aortic Atherosclerosis (ICD10-I70.0). Electronically Signed   By: Agustin Cree M.D.   On: 07/18/2023 19:46    Procedures Procedures    Medications Ordered in ED Medications  sodium chloride 0.9 % bolus 1,000 mL (has no administration in time range)  ondansetron (ZOFRAN) injection 4 mg (has no administration in time range)    ED Course/ Medical Decision Making/ A&P Clinical Course as of 07/19/23 1010  Thu Jul 18, 2023  2043 Reviewed results of workup with patient and his spouse.  Told him that the stool studies will end up taking probably a day or 2.  Clear liquid diet until then, can use Imodium and Zofran.  Return instructions discussed [MB]    Clinical  Course User Index [MB] Terrilee Files, MD                                 Medical Decision Making Amount and/or Complexity of Data Reviewed Labs: ordered. Radiology: ordered.  Risk Prescription drug management.   This patient complains of nausea vomiting diarrhea abdominal pain; this involves an extensive number of treatment Options and is a complaint that carries with it a high risk of complications and morbidity. The differential includes gastroenteritis, colitis, diverticulitis, pancreatitis  I ordered, reviewed and interpreted labs, which included CBC normal chemistries and LFTs normal urinalysis unremarkable stool studies sent I ordered medication IV fluids and nausea medication and reviewed PMP when indicated. I ordered imaging studies which included CT abdomen and pelvis and I  independently    visualized and interpreted imaging which showed no acute findings Additional history obtained from patient significant other Previous records obtained and reviewed in epic no recent admissions Social determinants considered, in epic and barriers Critical Interventions: None  After the interventions stated above, I reevaluated the patient and found patient to be feeling a little bit better Admission and further testing considered, we will hold off on antibiotics and have patient continue symptomatic management.  Recommended close follow-up with his PCP.  Return instructions discussed         Final Clinical Impression(s) / ED Diagnoses Final diagnoses:  Nausea vomiting and diarrhea  Generalized abdominal pain    Rx / DC Orders ED Discharge Orders          Ordered    ondansetron (ZOFRAN) 4 MG tablet  Every 8 hours PRN        07/18/23 2044              Terrilee Files, MD 07/19/23 1012

## 2023-07-19 LAB — GASTROINTESTINAL PANEL BY PCR, STOOL (REPLACES STOOL CULTURE)

## 2023-08-14 ENCOUNTER — Ambulatory Visit: Payer: BC Managed Care – PPO | Admitting: Gastroenterology

## 2023-08-16 ENCOUNTER — Encounter: Payer: Self-pay | Admitting: Gastroenterology

## 2023-08-16 ENCOUNTER — Ambulatory Visit (INDEPENDENT_AMBULATORY_CARE_PROVIDER_SITE_OTHER): Payer: BC Managed Care – PPO | Admitting: Gastroenterology

## 2023-08-16 VITALS — BP 126/79 | HR 97 | Temp 98.7°F | Ht 70.0 in | Wt 241.0 lb

## 2023-08-16 DIAGNOSIS — K529 Noninfective gastroenteritis and colitis, unspecified: Secondary | ICD-10-CM | POA: Diagnosis not present

## 2023-08-16 DIAGNOSIS — R112 Nausea with vomiting, unspecified: Secondary | ICD-10-CM | POA: Insufficient documentation

## 2023-08-16 DIAGNOSIS — R6881 Early satiety: Secondary | ICD-10-CM | POA: Diagnosis not present

## 2023-08-16 DIAGNOSIS — K219 Gastro-esophageal reflux disease without esophagitis: Secondary | ICD-10-CM | POA: Diagnosis not present

## 2023-08-16 DIAGNOSIS — R197 Diarrhea, unspecified: Secondary | ICD-10-CM

## 2023-08-16 MED ORDER — ONDANSETRON HCL 4 MG PO TABS: 4.0000 mg | ORAL_TABLET | Freq: Four times a day (QID) | ORAL | 0 refills | Status: AC | PRN

## 2023-08-16 NOTE — Progress Notes (Unsigned)
GI Office Note    Referring Provider: Billie Lade, MD Primary Care Physician:  Billie Lade, MD  Primary Gastroenterologist:  Chief Complaint   Chief Complaint  Patient presents with   New Patient (Initial Visit)    Pt referred from ED for diarrhea. Pt was better but he started back with vomiting and diarrhea with abd pain    History of Present Illness   Brendan Armstrong is a 48 y.o. male presenting today with abdominal pain, N/V/D.  Seen in ED 07/18/23 with 3 day history of abdominal pain, nausea, diarrhea.   GI path panel 06/2023: negative  CT A/P with contrast 06/2023: IMPRESSION: 1. No acute abdominopelvic findings. 2. Geographic hypoattenuation of segment 4 of the liver which may represent focal fatty infiltration. 3. Colonic diverticulosis without acute diverticulitis. 4. Small fat-containing paraumbilical hernia. 5.  Aortic Atherosclerosis (ICD10-I70.0).  Celiac disease panel in 2020 was negative.  Today: Always has had finnickly stomach. Postprandial BMs. IBS by Dr. Karilyn Cota.  BM at least five per day. May skip a day or two at times. Then pays for it. This morning solid stool. Protein bar, then BM, loose.  Adjust lifestyle around BMs.  Sometimes wakes up knowing day will be bad. Nausea, diarrhea. Dinner, ate, burches, cramping, hugh burp. Then felt better, smelly, lasted from 8pm to 3 am. Diarrhea three times with dinner  Christmas day: different. Stomach virus like. But worse. Thre up one time. Constant diarrhea. Nine days. Even after got over it, still having watery stoosl. Then felt normal. Woke up this Tuesday, felt it coming on . Went to gym. Vomting. 6-8 times. Lost voice from acid. Diarrhea. Vomiting went away same day.   No one else gets sick.  Busy body.  Come home tired and sits.  Christmas ?food posisoning.  Cramping abd pain, unsettled. With the burpy, gas build up. Stand up and belch and short time feel beter.  Chronic taste what  easts.  Omeprazole.ahs to take it. 20 years. No prior EGD.  Ibuprofen, not recently, 800mg  a day, none in few weeks.   Bloated/tight stomach. Early satiety, for years        No melena, brbpr No nocturnal stools. Aftermath of imodium worse   Colonoscopy 08/2018: -colon normal s/p bx neg for microscopic colitis -internal hemorrhoids   Medications   Current Outpatient Medications  Medication Sig Dispense Refill   cetirizine (ZYRTEC) 10 MG tablet Take 10 mg by mouth daily.     cholecalciferol (VITAMIN D) 25 MCG (1000 UT) tablet Take 1,000 Units by mouth daily.     ibuprofen (ADVIL,MOTRIN) 200 MG tablet Take 600 mg by mouth every 8 (eight) hours as needed (pain.).     lisinopril (ZESTRIL) 20 MG tablet Take 1 tablet (20 mg total) by mouth daily. 30 tablet 2   omeprazole (PRILOSEC) 20 MG capsule Take 20 mg by mouth daily before breakfast.      ondansetron (ZOFRAN) 4 MG tablet Take 1 tablet (4 mg total) by mouth every 8 (eight) hours as needed for nausea or vomiting. 20 tablet 0   vitamin C (ASCORBIC ACID) 500 MG tablet Take 500 mg by mouth daily.     No current facility-administered medications for this visit.    Allergies   Allergies as of 08/16/2023 - Review Complete 08/16/2023  Allergen Reaction Noted   Avelox [moxifloxacin hcl in nacl] Hives and Rash 06/25/2018     Past Medical History   Past Medical History:  Diagnosis Date  GERD (gastroesophageal reflux disease)    Hypertension    Seasonal allergies     Past Surgical History   Past Surgical History:  Procedure Laterality Date   CHOLECYSTECTOMY     cholecystitis   COLONOSCOPY N/A 08/27/2018   Procedure: COLONOSCOPY;  Surgeon: Malissa Hippo, MD;  Location: [facility];  Service: Endoscopy;  Laterality: N/A;  2:40    Past Family History   Family History  Problem Relation Age of Onset   Heart Problems Father    Heart Problems Paternal Grandfather    Colon cancer Neg Hx     Past Social History    Social History   Socioeconomic History   Marital status: Married    Spouse name: Not on file   Number of children: Not on file   Years of education: Not on file   Highest education level: Not on file  Occupational History   Not on file  Tobacco Use   Smoking status: Never   Smokeless tobacco: Never  Vaping Use   Vaping status: Never Used  Substance and Sexual Activity   Alcohol use: Never   Drug use: Never   Sexual activity: Not on file  Other Topics Concern   Not on file  Social History Narrative   Not on file   Social Drivers of Health   Financial Resource Strain: Not on file  Food Insecurity: Not on file  Transportation Needs: Not on file  Physical Activity: Not on file  Stress: Not on file  Social Connections: Not on file  Intimate Partner Violence: Not on file    Review of Systems   General: Negative for anorexia, weight loss, fever, chills, fatigue, weakness. ENT: Negative for hoarseness, difficulty swallowing , nasal congestion. CV: Negative for chest pain, angina, palpitations, dyspnea on exertion, peripheral edema.  Respiratory: Negative for dyspnea at rest, dyspnea on exertion, cough, sputum, wheezing.  GI: See history of present illness. GU:  Negative for dysuria, hematuria, urinary incontinence, urinary frequency, nocturnal urination.  Endo: Negative for unusual weight change.     Physical Exam   BP 126/79   Pulse 97   Temp 98.7 F (37.1 C)   Ht 5\' 10"  (1.778 m)   Wt 241 lb (109.3 kg)   BMI 34.58 kg/m    General: Well-nourished, well-developed in no acute distress.  Eyes: No icterus. Mouth: Oropharyngeal mucosa moist and pink , no lesions erythema or exudate. Lungs: Clear to auscultation bilaterally.  Heart: Regular rate and rhythm, no murmurs rubs or gallops.  Abdomen: Bowel sounds are normal, nontender, nondistended, no hepatosplenomegaly or masses,  no abdominal bruits or hernia , no rebound or guarding.  Rectal: ***  Extremities: No  lower extremity edema. No clubbing or deformities. Neuro: Alert and oriented x 4   Skin: Warm and dry, no jaundice.   Psych: Alert and cooperative, normal mood and affect.  Labs   Lab Results  Component Value Date   NA 136 07/18/2023   CL 99 07/18/2023   K 3.6 07/18/2023   CO2 25 07/18/2023   BUN 17 07/18/2023   CREATININE 1.00 07/18/2023   GFRNONAA >60 07/18/2023   CALCIUM 9.3 07/18/2023   ALBUMIN 4.2 07/18/2023   GLUCOSE 111 (H) 07/18/2023   Lab Results  Component Value Date   WBC 10.2 07/18/2023   HGB 17.7 (H) 07/18/2023   HCT 50.8 07/18/2023   MCV 91.7 07/18/2023   PLT 291 07/18/2023   Lab Results  Component Value Date   ALT  36 07/18/2023   AST 24 07/18/2023   ALKPHOS 72 07/18/2023   BILITOT 0.7 07/18/2023    Imaging Studies   CT ABDOMEN PELVIS W CONTRAST Result Date: 07/18/2023 CLINICAL DATA:  Several day history of nausea, diarrhea, and increased gas EXAM: CT ABDOMEN AND PELVIS WITH CONTRAST TECHNIQUE: Multidetector CT imaging of the abdomen and pelvis was performed using the standard protocol following bolus administration of intravenous contrast. RADIATION DOSE REDUCTION: This exam was performed according to the departmental dose-optimization program which includes automated exposure control, adjustment of the mA and/or kV according to patient size and/or use of iterative reconstruction technique. CONTRAST:  OMNIPAQUE IOHEXOL 300 MG/ML  SOLN COMPARISON:  None Available. FINDINGS: Lower chest: No focal consolidation or pulmonary nodule in the lung bases. No pleural effusion or pneumothorax demonstrated. Partially imaged heart size is normal. Hepatobiliary: Geographic hypoattenuation of segment 4. no intra or extrahepatic biliary ductal dilation. Normal gallbladder. Pancreas: No focal lesions or main ductal dilation. Spleen: Normal in size without focal abnormality. Adrenals/Urinary Tract: No adrenal nodules. No suspicious renal mass, calculi or hydronephrosis. No  focal bladder wall thickening. Stomach/Bowel: Normal appearance of the stomach. No evidence of bowel wall thickening, distention, or inflammatory changes. Colonic diverticulosis without acute diverticulitis. Normal appendix. Vascular/Lymphatic: Aortic atherosclerosis. No enlarged abdominal or pelvic lymph nodes. Reproductive: Prostate is unremarkable. Other: No free fluid, fluid collection, or free air. Musculoskeletal: No acute or abnormal lytic or blastic osseous lesions. Small fat-containing paraumbilical hernia. IMPRESSION: 1. No acute abdominopelvic findings. 2. Geographic hypoattenuation of segment 4 of the liver which may represent focal fatty infiltration. 3. Colonic diverticulosis without acute diverticulitis. 4. Small fat-containing paraumbilical hernia. 5.  Aortic Atherosclerosis (ICD10-I70.0). Electronically Signed   By: Agustin Cree M.D.   On: 07/18/2023 19:46    Assessment       PLAN   ***want to rule out bad stuff But if its abnormal and labs are normal than why ***does not have gb  Leanna Battles. Melvyn Neth, MHS, PA-C [facility]

## 2023-08-16 NOTE — Patient Instructions (Addendum)
Complete labs and stool studies. We will be in touch with results as available.  Continue omeprazole daily before breakfast.  RX for Zofran sent to your pharmacy to have on hand for nausea/vomiting.  Please reach out if any questions or concerns.

## 2023-08-18 LAB — CBC WITH DIFFERENTIAL/PLATELET
Basophils Absolute: 0.1 10*3/uL (ref 0.0–0.2)
Basos: 1 %
EOS (ABSOLUTE): 0.4 10*3/uL (ref 0.0–0.4)
Eos: 4 %
Hematocrit: 49.9 % (ref 37.5–51.0)
Hemoglobin: 16.2 g/dL (ref 13.0–17.7)
Immature Grans (Abs): 0.1 10*3/uL (ref 0.0–0.1)
Immature Granulocytes: 1 %
Lymphocytes Absolute: 2.5 10*3/uL (ref 0.7–3.1)
Lymphs: 27 %
MCH: 31.5 pg (ref 26.6–33.0)
MCHC: 32.5 g/dL (ref 31.5–35.7)
MCV: 97 fL (ref 79–97)
Monocytes Absolute: 1 10*3/uL — ABNORMAL HIGH (ref 0.1–0.9)
Monocytes: 10 %
Neutrophils Absolute: 5.5 10*3/uL (ref 1.4–7.0)
Neutrophils: 57 %
Platelets: 263 10*3/uL (ref 150–450)
RBC: 5.15 x10E6/uL (ref 4.14–5.80)
RDW: 11.7 % (ref 11.6–15.4)
WBC: 9.5 10*3/uL (ref 3.4–10.8)

## 2023-08-18 LAB — TISSUE TRANSGLUTAMINASE, IGA

## 2023-08-18 LAB — C-REACTIVE PROTEIN: CRP: <1 mg/L (ref 0–10)

## 2023-08-18 LAB — TSH+FREE T4
Free T4: 1.12 ng/dL (ref 0.82–1.77)
TSH: 2.11 u[IU]/mL (ref 0.450–4.500)

## 2023-08-18 LAB — ALPHA-GAL PANEL: IgE (Immunoglobulin E), Serum: 521 [IU]/mL — ABNORMAL HIGH (ref 6–495)

## 2023-08-18 LAB — SEDIMENTATION RATE: Sed Rate: 2 mm/h (ref 0–15)

## 2023-08-18 LAB — IGA: IgA/Immunoglobulin A, Serum: 204 mg/dL (ref 90–386)

## 2023-08-22 LAB — CALPROTECTIN, FECAL: Calprotectin, Fecal: 57 ug/g (ref 0–120)

## 2023-08-22 LAB — PANCREATIC ELASTASE, FECAL: Pancreatic Elastase, Fecal: >800 ug Elast./g (ref >200)

## 2023-08-28 MED ORDER — PEG 3350-KCL-NA BICARB-NACL 420 G PO SOLR: 4000.0000 mL | Freq: Once | ORAL | 0 refills | Status: AC

## 2023-08-28 NOTE — Telephone Encounter (Signed)
 Per [employer] PA for [employer] "The member does not have SOC coverage. The procedure cannot be added" PA for EGD "Order ID: [id number]       Authorized Approval Valid Through: 08/28/2023 - 10/26/2023"

## 2023-09-27 ENCOUNTER — Ambulatory Visit (HOSPITAL_COMMUNITY): Admitting: Anesthesiology

## 2023-09-27 ENCOUNTER — Encounter (HOSPITAL_COMMUNITY)
Admission: RE | Disposition: A | Discharge status: Home / Self Care | Payer: Self-pay | Source: Home / Self Care | Attending: Internal Medicine

## 2023-09-27 ENCOUNTER — Ambulatory Visit (HOSPITAL_COMMUNITY)
Admission: RE | Admit: 2023-09-27 | Discharge: 2023-09-27 | Disposition: A | Discharge status: Home / Self Care | Payer: BC Managed Care – PPO | Attending: Internal Medicine | Admitting: Internal Medicine

## 2023-09-27 ENCOUNTER — Telehealth: Payer: Self-pay

## 2023-09-27 ENCOUNTER — Other Ambulatory Visit: Payer: Self-pay

## 2023-09-27 ENCOUNTER — Encounter (HOSPITAL_COMMUNITY): Payer: Self-pay | Admitting: Internal Medicine

## 2023-09-27 DIAGNOSIS — K529 Noninfective gastroenteritis and colitis, unspecified: Secondary | ICD-10-CM | POA: Diagnosis present

## 2023-09-27 DIAGNOSIS — K317 Polyp of stomach and duodenum: Secondary | ICD-10-CM | POA: Diagnosis not present

## 2023-09-27 DIAGNOSIS — G4730 Sleep apnea, unspecified: Secondary | ICD-10-CM | POA: Insufficient documentation

## 2023-09-27 DIAGNOSIS — Z79899 Other long term (current) drug therapy: Secondary | ICD-10-CM | POA: Insufficient documentation

## 2023-09-27 DIAGNOSIS — K259 Gastric ulcer, unspecified as acute or chronic, without hemorrhage or perforation: Secondary | ICD-10-CM

## 2023-09-27 DIAGNOSIS — K219 Gastro-esophageal reflux disease without esophagitis: Secondary | ICD-10-CM | POA: Insufficient documentation

## 2023-09-27 DIAGNOSIS — Z13810 Encounter for screening for upper gastrointestinal disorder: Secondary | ICD-10-CM | POA: Diagnosis present

## 2023-09-27 DIAGNOSIS — I10 Essential (primary) hypertension: Secondary | ICD-10-CM | POA: Diagnosis not present

## 2023-09-27 HISTORY — DX: Personal history of urinary calculi: Z87.442

## 2023-09-27 HISTORY — PX: ESOPHAGOGASTRODUODENOSCOPY (EGD) WITH PROPOFOL: SHX5813

## 2023-09-27 HISTORY — PX: COLONOSCOPY WITH PROPOFOL: SHX5780

## 2023-09-27 HISTORY — PX: POLYPECTOMY: SHX5525

## 2023-09-27 HISTORY — DX: Sleep apnea, unspecified: G47.30

## 2023-09-27 HISTORY — PX: HEMOSTASIS CLIP PLACEMENT: SHX6857

## 2023-09-27 SURGERY — COLONOSCOPY WITH PROPOFOL
Anesthesia: General

## 2023-09-27 MED ORDER — PHENYLEPHRINE 80 MCG/ML (10ML) SYRINGE FOR IV PUSH (FOR BLOOD PRESSURE SUPPORT)
PREFILLED_SYRINGE | INTRAVENOUS | Status: AC
  Filled 2023-09-27: qty 10

## 2023-09-27 MED ORDER — OMEPRAZOLE 40 MG PO CPDR: 40.0000 mg | DELAYED_RELEASE_CAPSULE | Freq: Two times a day (BID) | ORAL | 11 refills | Status: AC

## 2023-09-27 MED ORDER — GLYCOPYRROLATE PF 0.2 MG/ML IJ SOSY
PREFILLED_SYRINGE | INTRAMUSCULAR | Status: AC
  Filled 2023-09-27: qty 1

## 2023-09-27 MED ORDER — LACTATED RINGERS IV SOLN: INTRAVENOUS | Status: DC

## 2023-09-27 MED ORDER — LIDOCAINE HCL (CARDIAC) PF 100 MG/5ML IV SOSY
PREFILLED_SYRINGE | INTRAVENOUS | Status: DC | PRN
Start: 2023-09-27 — End: 2023-09-27
  Administered 2023-09-27: 60 mg via INTRATRACHEAL

## 2023-09-27 MED ORDER — DEXMEDETOMIDINE HCL IN NACL 80 MCG/20ML IV SOLN
INTRAVENOUS | Status: DC | PRN
  Administered 2023-09-27: 4 ug via INTRAVENOUS

## 2023-09-27 MED ORDER — PROPOFOL 500 MG/50ML IV EMUL
INTRAVENOUS | Status: DC | PRN
  Administered 2023-09-27: 150 ug/kg/min via INTRAVENOUS

## 2023-09-27 MED ORDER — PROPOFOL 500 MG/50ML IV EMUL
INTRAVENOUS | Status: AC
  Filled 2023-09-27: qty 50

## 2023-09-27 MED ORDER — GLYCOPYRROLATE PF 0.2 MG/ML IJ SOSY
PREFILLED_SYRINGE | INTRAMUSCULAR | Status: DC | PRN
  Administered 2023-09-27 (×2): .1 mg via INTRAVENOUS

## 2023-09-27 MED ORDER — DEXMEDETOMIDINE HCL IN NACL 80 MCG/20ML IV SOLN
INTRAVENOUS | Status: AC
  Filled 2023-09-27: qty 20

## 2023-09-27 MED ORDER — PROPOFOL 10 MG/ML IV BOLUS
INTRAVENOUS | Status: DC | PRN
  Administered 2023-09-27 (×2): 30 mg via INTRAVENOUS
  Administered 2023-09-27: 80 mg via INTRAVENOUS
  Administered 2023-09-27: 30 mg via INTRAVENOUS

## 2023-09-27 MED ORDER — PHENYLEPHRINE 80 MCG/ML (10ML) SYRINGE FOR IV PUSH (FOR BLOOD PRESSURE SUPPORT)
PREFILLED_SYRINGE | INTRAVENOUS | Status: DC | PRN
  Administered 2023-09-27 (×2): 160 ug via INTRAVENOUS

## 2023-09-27 NOTE — Op Note (Signed)
 [facility] Patient Name: Brendan Armstrong Procedure Date: 09/27/2023 10:46 AM MRN: [id number] Date of Birth: [birth year] Attending MD: Gennette Pac , MD, [id number] CSN: [id number] Age: 48 Admit Type: Outpatient Procedure:                Colonoscopy Indications:              Chronic diarrhea Providers:                Gennette Pac, MD, Sheran Fava, Lennice Sites Technician, Technician Referring MD:              Medicines:                Propofol per Anesthesia Complications:            No immediate complications. Estimated Blood Loss:     Estimated blood loss was minimal. Procedure:                Pre-Anesthesia Assessment:                           - Prior to the procedure, a History and Physical                            was performed, and patient medications and                            allergies were reviewed. The patient's tolerance of                            previous anesthesia was also reviewed. The risks                            and benefits of the procedure and the sedation                            options and risks were discussed with the patient.                            All questions were answered, and informed consent                            was obtained. Prior Anticoagulants: The patient has                            taken no anticoagulant or antiplatelet agents. ASA                            Grade Assessment: II - A patient with mild systemic                            disease. After reviewing the risks and benefits,  the patient was deemed in satisfactory condition to                            undergo the procedure.                           After obtaining informed consent, the colonoscope                            was passed under direct vision. Throughout the                            procedure, the patient's blood pressure, pulse, and                            oxygen  saturations were monitored continuously. The                            [id number]) scope was introduced through the                            anus and advanced to the the cecum, identified by                            appendiceal orifice and ileocecal valve. The                            colonoscopy was performed without difficulty. The                            patient tolerated the procedure well. The quality                            of the bowel preparation was adequate. The entire                            colon was well visualized. Scope In: 11:20:42 AM Scope Out: 11:33:34 AM Scope Withdrawal Time: 0 hours 8 minutes 35 seconds  Total Procedure Duration: 0 hours 12 minutes 52 seconds  Findings:      The perianal and digital rectal examinations were normal.      The colon (entire examined portion) appeared normal.      The retroflexed view of the distal rectum and anal verge was normal and       showed no anal or rectal abnormalities. Single biopsies the right and       left colon taken for histologic study. Impression:               - The entire examined colon is normal. Opening to                            ileocecal valve angulated down towards the cecum. I                            was not able to intubate.                           -  The distal rectum and anal verge are normal on                            retroflexion view. Status post segmental biopsy.                           - No specimens collected. Moderate Sedation:      Moderate (conscious) sedation was personally administered by an       anesthesia professional. The following parameters were monitored: oxygen       saturation, heart rate, blood pressure, and response to care. Recommendation:           - Patient has a contact number available for                            emergencies. The signs and symptoms of potential                            delayed complications were discussed with the                             patient. Return to normal activities tomorrow.                            Written discharge instructions were provided to the                            patient.                           - Advance diet as tolerated.                           - Continue present medications.                           - Repeat colonoscopy in 10 years for screening                            purposes. Follow-up on path. See EGD report.                           - Return to GI office in 3 months. Procedure Code(s):        --- Professional ---                           [phone], Colonoscopy, flexible; diagnostic, including                            collection of specimen(s) by brushing or washing,                            when performed (separate procedure) Diagnosis Code(s):        --- Professional ---  K52.9, Noninfective gastroenteritis and colitis,                            unspecified CPT copyright 2022 American Medical Association. All rights reserved. The codes documented in this report are preliminary and upon coder review may  be revised to meet current compliance requirements. Gerrit Friends. Aliese Brannum, MD Gennette Pac, MD 09/27/2023 11:51:30 AM This report has been signed electronically. Number of Addenda: 0

## 2023-09-27 NOTE — H&P (Signed)
 @LOGO @   Primary Care Physician:  Billie Lade, MD Primary Gastroenterologist:  Dr.   Pre-Procedure History & Physical: HPI:  Brendan Armstrong is a 48 y.o. male here for   Further evaluation of chronic diarrhea and a borderline elevated fecal calprotectin via ileocolonoscopy longstanding GERD here for Barrett's screening as well.    Chronic  Past Medical History:  Diagnosis Date   GERD (gastroesophageal reflux disease)    History of kidney stones    Hypertension    Seasonal allergies    Sleep apnea     Past Surgical History:  Procedure Laterality Date   CHOLECYSTECTOMY  2016   cholecystitis   COLONOSCOPY N/A 08/27/2018   Procedure: COLONOSCOPY;  Surgeon: Malissa Hippo, MD;  Location: [facility];  Service: Endoscopy;  Laterality: N/A;  2:40    Prior to Admission medications   Medication Sig Start Date End Date Taking? Authorizing Provider  cetirizine (ZYRTEC) 10 MG tablet Take 10 mg by mouth daily.   Yes [provider]  cholecalciferol (VITAMIN D) 25 MCG (1000 UT) tablet Take 1,000 Units by mouth daily.   Yes [provider]  ibuprofen (ADVIL,MOTRIN) 200 MG tablet Take 600 mg by mouth every 8 (eight) hours as needed (pain.).   Yes [provider]  lisinopril (ZESTRIL) 20 MG tablet Take 1 tablet (20 mg total) by mouth daily. 07/13/21  Yes Iran Ouch, MD  omeprazole (PRILOSEC) 20 MG capsule Take 20 mg by mouth daily before breakfast.    Yes [provider]  vitamin C (ASCORBIC ACID) 500 MG tablet Take 500 mg by mouth daily.   Yes [provider]  ondansetron (ZOFRAN) 4 MG tablet Take 1 tablet (4 mg total) by mouth every 6 (six) hours as needed for nausea or vomiting. 08/16/23   Tiffany Kocher, PA-C    Allergies as of 08/28/2023 - Review Complete 08/16/2023  Allergen Reaction Noted   Avelox [moxifloxacin hcl in nacl] Hives and Rash 06/25/2018    Family History  Problem Relation Age of Onset   Heart Problems Father     Heart Problems Paternal Grandfather    Colon cancer Neg Hx     Social History   Socioeconomic History   Marital status: Married    Spouse name: Not on file   Number of children: Not on file   Years of education: Not on file   Highest education level: Not on file  Occupational History   Not on file  Tobacco Use   Smoking status: Never   Smokeless tobacco: Never  Vaping Use   Vaping status: Never Used  Substance and Sexual Activity   Alcohol use: Never   Drug use: Never   Sexual activity: Not on file  Other Topics Concern   Not on file  Social History Narrative   Not on file   Social Drivers of Health   Financial Resource Strain: Not on file  Food Insecurity: Not on file  Transportation Needs: Not on file  Physical Activity: Not on file  Stress: Not on file  Social Connections: Not on file  Intimate Partner Violence: Not on file    Review of Systems: See HPI, otherwise negative ROS  Physical Exam: BP (!) 147/83   Pulse 77   Temp 97.8 F (36.6 C) (Oral)   Resp 16   Ht 5\' 10"  (1.778 m)   Wt 108.9 kg   SpO2 98%   BMI 34.44 kg/m  General:   Alert,  Well-developed,  well-nourished, pleasant and cooperative in NAD Skin:  Intact without significant lesions or rashes. Eyes:  Sclera clear, no icterus.   Conjunctiva pink. Ears:  Normal auditory acuity. Nose:  No deformity, discharge,  or lesions. Mouth:  No deformity or lesions. Neck:  Supple; no masses or thyromegaly. No significant cervical adenopathy. Lungs:  Clear throughout to auscultation.   No wheezes, crackles, or rhonchi. No acute distress. Heart:  Regular rate and rhythm; no murmurs, clicks, rubs,  or gallops. Abdomen: Non-distended, normal bowel sounds.  Soft and nontender without appreciable mass or hepatosplenomegaly.  Pulses:  Normal pulses noted. Extremities:  Without clubbing or edema.  Impression/Plan:   48 year old gentleman with chronic diarrhea borderline elevated inflammatory markers.  No  dysphagia.  Longstanding GERD well-controlled on omeprazole 20 mg daily EGD for Barrett's screening followed by ileocolonoscopy.  Risk benefits limitations alternatives reviewed.  Questions answered all parties agreeable.     Notice: This dictation was prepared with Dragon dictation along with smaller phrase technology. Any transcriptional errors that result from this process are unintentional and may not be corrected upon review.

## 2023-09-27 NOTE — Discharge Instructions (Addendum)
 EGD Discharge instructions Please read the instructions outlined below and refer to this sheet in the next few weeks. These discharge instructions provide you with general information on caring for yourself after you leave the hospital. Your doctor may also give you specific instructions. While your treatment has been planned according to the most current medical practices available, unavoidable complications occasionally occur. If you have any problems or questions after discharge, please call your doctor. ACTIVITY You may resume your regular activity but move at a slower pace for the next 24 hours.  Take frequent rest periods for the next 24 hours.  Walking will help expel (get rid of) the air and reduce the bloated feeling in your abdomen.  No driving for 24 hours (because of the anesthesia (medicine) used during the test).  You may shower.  Do not sign any important legal documents or operate any machinery for 24 hours (because of the anesthesia used during the test).  NUTRITION Drink plenty of fluids.  You may resume your normal diet.  Begin with a light meal and progress to your normal diet.  Avoid alcoholic beverages for 24 hours or as instructed by your caregiver.  MEDICATIONS You may resume your normal medications unless your caregiver tells you otherwise.  WHAT YOU CAN EXPECT TODAY You may experience abdominal discomfort such as a feeling of fullness or "gas" pains.  FOLLOW-UP Your doctor will discuss the results of your test with you.  SEEK IMMEDIATE MEDICAL ATTENTION IF ANY OF THE FOLLOWING OCCUR: Excessive nausea (feeling sick to your stomach) and/or vomiting.  Severe abdominal pain and distention (swelling).  Trouble swallowing.  Temperature over 101 F (37.8 C).  Rectal bleeding or vomiting of blood.    Colonoscopy Discharge Instructions  Read the instructions outlined below and refer to this sheet in the next few weeks. These discharge instructions provide you with  general information on caring for yourself after you leave the hospital. Your doctor may also give you specific instructions. While your treatment has been planned according to the most current medical practices available, unavoidable complications occasionally occur. If you have any problems or questions after discharge, call Dr. Jena Gauss at [phone]. ACTIVITY You may resume your regular activity, but move at a slower pace for the next 24 hours.  Take frequent rest periods for the next 24 hours.  Walking will help get rid of the air and reduce the bloated feeling in your belly (abdomen).  No driving for 24 hours (because of the medicine (anesthesia) used during the test).   Do not sign any important legal documents or operate any machinery for 24 hours (because of the anesthesia used during the test).  NUTRITION Drink plenty of fluids.  You may resume your normal diet as instructed by your doctor.  Begin with a light meal and progress to your normal diet. Heavy or fried foods are harder to digest and may make you feel sick to your stomach (nauseated).  Avoid alcoholic beverages for 24 hours or as instructed.  MEDICATIONS You may resume your normal medications unless your doctor tells you otherwise.  WHAT YOU CAN EXPECT TODAY Some feelings of bloating in the abdomen.  Passage of more gas than usual.  Spotting of blood in your stool or on the toilet paper.  IF YOU HAD POLYPS REMOVED DURING THE COLONOSCOPY: No aspirin products for 7 days or as instructed.  No alcohol for 7 days or as instructed.  Eat a soft diet for the next 24 hours.  FINDING  OUT THE RESULTS OF YOUR TEST Not all test results are available during your visit. If your test results are not back during the visit, make an appointment with your caregiver to find out the results. Do not assume everything is normal if you have not heard from your caregiver or the medical facility. It is important for you to follow up on all of your test  results.  SEEK IMMEDIATE MEDICAL ATTENTION IF: You have more than a spotting of blood in your stool.  Your belly is swollen (abdominal distention).  You are nauseated or vomiting.  You have a temperature over 101.  You have abdominal pain or discomfort that is severe or gets worse throughout the day.     no Barrett's esophagus or esophagitis.  You do have an ulcerated stomach.  This is caused by ibuprofen most likely.  Biopsies taken.  Gastric polyp removed  Ibuprofen may be contributing to your diarrhea as well.  Your colon appeared normal biopsies taken.  Avoid ibuprofen (get with your primary care provider to come up with a substitute that will not hurt your stomach)   increase omeprazole to 40 mg orally twice daily.  A new prescription is already been sent to your drugstore   further recommendations to follow pending review of pathology report   office visit with Tana Coast in 3 months.  Office will notify you   at patient request, I called Brendan Armstrong at [phone] discussed findings and recommendations

## 2023-09-27 NOTE — Op Note (Signed)
 [facility] Patient Name: Brendan Armstrong Procedure Date: 09/27/2023 10:48 AM MRN: [id number] Date of Birth: Sep 18, 1975 Attending MD: Gennette Pac , MD, [id number] CSN: [id number] Age: 48 Admit Type: Outpatient Procedure:                Upper GI endoscopy Indications:              Screening for Barrett's esophagus Providers:                Gennette Pac, MD, Sheran Fava, Lennice Sites Technician, Technician Referring MD:              Medicines:                Propofol per Anesthesia Complications:            No immediate complications. Estimated Blood Loss:     Estimated blood loss was minimal. Procedure:                Pre-Anesthesia Assessment:                           - Prior to the procedure, a History and Physical                            was performed, and patient medications and                            allergies were reviewed. The patient's tolerance of                            previous anesthesia was also reviewed. The risks                            and benefits of the procedure and the sedation                            options and risks were discussed with the patient.                            All questions were answered, and informed consent                            was obtained. Prior Anticoagulants: The patient has                            taken no anticoagulant or antiplatelet agents. ASA                            Grade Assessment: II - A patient with mild systemic                            disease. After reviewing the risks and benefits,  the patient was deemed in satisfactory condition to                            undergo the procedure.                           After obtaining informed consent, the endoscope was                            passed under direct vision. Throughout the                            procedure, the patient's blood pressure, pulse, and                             oxygen saturations were monitored continuously. The                            [id number]-[id number] ([id number]) scope was introduced through the                            mouth, and advanced to the second part of duodenum.                            The upper GI endoscopy was accomplished without                            difficulty. The patient tolerated the procedure                            well. Scope In: 11:03:24 AM Scope Out: 11:14:46 AM Total Procedure Duration: 0 hours 11 minutes 22 seconds  Findings:      Normal-appearing esophagus undulating Z-line. No esophagitis no       Barrett's epithelium seen      Stomach empty. Multiple hyperplastic appearing polyps. A couple of areas       of linear ulceration in the antrum. Please see photos. I did not see it       on infiltrating process. Pylorus patent. D1 and D2 appeared normal      Biopsies of the ulcerated gastric mucosa taken for histologic study. If       biopsies of the ulcerated antral mucosa taken. One of the larger       hyperplastic polyp was hot snared and removed with a rescue net. 1 clip       placed. Impression:               - Normal esophagus. Hyperplastic appearing polyps 1                            snared and clipped. Ulcerated antral mucosa                            -biopsied Moderate Sedation:      Moderate (conscious) sedation was personally administered by an       anesthesia professional. The following parameters were monitored: oxygen  saturation, heart rate, blood pressure, respiratory rate, EKG, adequacy       of pulmonary ventilation, and response to care. Recommendation:           - Patient has a contact number available for                            emergencies. The signs and symptoms of potential                            delayed complications were discussed with the                            patient. Return to normal activities tomorrow.                            Written discharge  instructions were provided to the                            patient.                           - Advance diet as tolerated.                           - Continue present medications.                           - Return to my office in 3 months. Follow-up on                            pathology. Escalate omeprazole to 40 mg orally                            twice daily. Avoid all ibuprofen and NSAIDs. See                            colonoscopy report. Procedure Code(s):        --- Professional ---                           [phone], Esophagogastroduodenoscopy, flexible,                            transoral; diagnostic, including collection of                            specimen(s) by brushing or washing, when performed                            (separate procedure) Diagnosis Code(s):        --- Professional ---                           H84.696, Encounter for screening for upper  gastrointestinal disorder CPT copyright 2022 American Medical Association. All rights reserved. The codes documented in this report are preliminary and upon coder review may  be revised to meet current compliance requirements. Gerrit Friends. Kiasia Chou, MD Gennette Pac, MD 09/27/2023 11:44:00 AM This report has been signed electronically. Number of Addenda: 0

## 2023-09-27 NOTE — Transfer of Care (Addendum)
 Immediate Anesthesia Transfer of Care Note  Patient: Brendan Armstrong  Procedure(s) Performed: COLONOSCOPY WITH PROPOFOL ESOPHAGOGASTRODUODENOSCOPY (EGD) WITH PROPOFOL POLYPECTOMY CONTROL OF HEMORRHAGE, GI TRACT, ENDOSCOPIC, BY CLIPPING OR OVERSEWING  Patient Location: Endoscopy Unit  Anesthesia Type:General  Level of Consciousness: drowsy and patient cooperative  Airway & Oxygen Therapy: Patient spontaneously breathing. Patient connected to face mask.  Post-op Assessment: Report given to RN and Post -op Vital signs reviewed and stable  Post vital signs: Reviewed and stable  Last Vitals:  Vitals Value Taken Time  BP 85/47 09/27/23   1137  Temp 36.5 09/27/23   1137  Pulse 89 09/27/23   1137  Resp 16 09/27/23   1137  SpO2 94% 09/27/23   1137    Last Pain:  Vitals:   09/27/23 1059  TempSrc:   PainSc: 0-No pain      Patients Stated Pain Goal: 7 (09/27/23 0957)  Complications: Subsequent blood pressure 105/72 at 1142 on 09/27/23.

## 2023-09-27 NOTE — Anesthesia Preprocedure Evaluation (Signed)
 Anesthesia Evaluation  Patient identified by MRN, date of birth, ID band Patient awake    Reviewed: Allergy & Precautions, H&P , NPO status , Patient's Chart, lab work & pertinent test results, reviewed documented beta blocker date and time   Airway Mallampati: II  TM Distance: >3 FB Neck ROM: full    Dental no notable dental hx.    Pulmonary sleep apnea    Pulmonary exam normal breath sounds clear to auscultation       Cardiovascular Exercise Tolerance: Good hypertension,  Rhythm:regular Rate:Normal     Neuro/Psych negative neurological ROS  negative psych ROS   GI/Hepatic Neg liver ROS,GERD  ,,  Endo/Other  negative endocrine ROS    Renal/GU negative Renal ROS  negative genitourinary   Musculoskeletal   Abdominal   Peds  Hematology negative hematology ROS (+)   Anesthesia Other Findings   Reproductive/Obstetrics negative OB ROS                             Anesthesia Physical Anesthesia Plan  ASA: 2  Anesthesia Plan: General   Post-op Pain Management:    Induction:   PONV Risk Score and Plan: Propofol infusion  Airway Management Planned:   Additional Equipment:   Intra-op Plan:   Post-operative Plan:   Informed Consent: I have reviewed the patients History and Physical, chart, labs and discussed the procedure including the risks, benefits and alternatives for the proposed anesthesia with the patient or authorized representative who has indicated his/her understanding and acceptance.     Dental Advisory Given  Plan Discussed with: CRNA  Anesthesia Plan Comments:        Anesthesia Quick Evaluation

## 2023-09-27 NOTE — Telephone Encounter (Signed)
-----   Message from Eula Listen sent at 09/27/2023 11:16 AM EST -----   New prescription stop omeprazole 20 mg daily begin omeprazole 40 mg orally twice daily best taken 30 minutes before breakfast and supper.  Dispense 60 with 3 refills.

## 2023-09-30 ENCOUNTER — Encounter: Payer: Self-pay | Admitting: Internal Medicine

## 2023-09-30 ENCOUNTER — Ambulatory Visit (INDEPENDENT_AMBULATORY_CARE_PROVIDER_SITE_OTHER): Payer: Self-pay | Admitting: Internal Medicine

## 2023-09-30 VITALS — BP 138/76 | HR 84 | Ht 70.0 in | Wt 240.2 lb

## 2023-09-30 DIAGNOSIS — I10 Essential (primary) hypertension: Secondary | ICD-10-CM | POA: Diagnosis not present

## 2023-09-30 DIAGNOSIS — Z13220 Encounter for screening for lipoid disorders: Secondary | ICD-10-CM

## 2023-09-30 DIAGNOSIS — Z0001 Encounter for general adult medical examination with abnormal findings: Secondary | ICD-10-CM

## 2023-09-30 DIAGNOSIS — K219 Gastro-esophageal reflux disease without esophagitis: Secondary | ICD-10-CM | POA: Diagnosis not present

## 2023-09-30 DIAGNOSIS — Z131 Encounter for screening for diabetes mellitus: Secondary | ICD-10-CM

## 2023-09-30 DIAGNOSIS — E66811 Obesity, class 1: Secondary | ICD-10-CM

## 2023-09-30 DIAGNOSIS — Z114 Encounter for screening for human immunodeficiency virus [HIV]: Secondary | ICD-10-CM | POA: Diagnosis not present

## 2023-09-30 DIAGNOSIS — N529 Male erectile dysfunction, unspecified: Secondary | ICD-10-CM

## 2023-09-30 DIAGNOSIS — Z125 Encounter for screening for malignant neoplasm of prostate: Secondary | ICD-10-CM

## 2023-09-30 DIAGNOSIS — Z1159 Encounter for screening for other viral diseases: Secondary | ICD-10-CM

## 2023-09-30 DIAGNOSIS — Z1329 Encounter for screening for other suspected endocrine disorder: Secondary | ICD-10-CM

## 2023-09-30 LAB — SURGICAL PATHOLOGY

## 2023-09-30 NOTE — Anesthesia Postprocedure Evaluation (Signed)
 Anesthesia Post Note  Patient: Brendan Armstrong  Procedure(s) Performed: COLONOSCOPY WITH PROPOFOL ESOPHAGOGASTRODUODENOSCOPY (EGD) WITH PROPOFOL POLYPECTOMY CONTROL OF HEMORRHAGE, GI TRACT, ENDOSCOPIC, BY CLIPPING OR OVERSEWING  Patient location during evaluation: Phase II Anesthesia Type: General Level of consciousness: awake Pain management: pain level controlled Vital Signs Assessment: post-procedure vital signs reviewed and stable Respiratory status: spontaneous breathing and respiratory function stable Cardiovascular status: blood pressure returned to baseline and stable Postop Assessment: no headache and no apparent nausea or vomiting Anesthetic complications: no Comments: Late entry   No notable events documented.   Last Vitals:  Vitals:   09/27/23 1137 09/27/23 1142  BP: (!) 85/47 105/72  Pulse: 89 90  Resp: 16 16  Temp: 36.5 C   SpO2: 94% 96%    Last Pain:  Vitals:   09/27/23 1137  TempSrc: Oral  PainSc: 0-No pain                 Windell Norfolk

## 2023-09-30 NOTE — Assessment & Plan Note (Signed)
 His acute concern today is erectile dysfunction.  He describes difficulty both achieving and maintaining erections for the last year.  Testosterone levels checked by his previous PCP reportedly within normal limits.  He is interested in as needed medication for symptom relief.  Will update basic labs today and pending results prescribe PDE 5 inhibitor for as needed use.  No contraindications noted.

## 2023-09-30 NOTE — Assessment & Plan Note (Signed)
 He is currently prescribed lisinopril 20 mg daily.  No medication changes are indicated today.

## 2023-09-30 NOTE — Patient Instructions (Signed)
 It was a pleasure to see you today.  Thank you for giving [place] the opportunity to be involved in your care.  Below is a brief recap of your visit and next steps.  We will plan to see you again in 6 months.   Summary You have established care today  No medication changes were made We will update labs and plan for follow up in 6 months

## 2023-09-30 NOTE — Progress Notes (Signed)
 New Patient Office Visit  Subjective    Patient ID: Brendan Armstrong, male    DOB: 03-13-1976  Age: 48 y.o. MRN: [id number]  CC:  Chief Complaint  Patient presents with   Establish Care    HPI Brendan Armstrong presents to establish care and for physical.  He is a 48 year old male with a documented past medical history of HTN and chronic GERD.  Previously followed by Dr. Jonelle Sports and [facility].  Mr. Arrazola reports feeling well today.  He is asymptomatic currently.  His acute concern is erectile dysfunction.  He describes symptoms of difficulty achieving and maintaining erections for the last year.  He previously discussed the symptoms with his former PCP, who checked testosterone levels and family they were within normal limits.  He is interested in as needed medication for symptom relief.  He does not have any additional concerns to discuss.  He currently works as a Emergency planning/management officer for [employer].  Denies tobacco, alcohol, and illicit drug use.  Family medical history is significant for diabetes mellitus and head/neck cancer in his father.  Acute concerns, chronic medical conditions, and outstanding preventative care items discussed today are individually addressed in A/P below.   Outpatient Encounter Medications as of 09/30/2023  Medication Sig   cetirizine (ZYRTEC) 10 MG tablet Take 10 mg by mouth daily.   cholecalciferol (VITAMIN D) 25 MCG (1000 UT) tablet Take 1,000 Units by mouth daily.   lisinopril (ZESTRIL) 20 MG tablet Take 1 tablet (20 mg total) by mouth daily.   omeprazole (PRILOSEC) 40 MG capsule Take 1 capsule (40 mg total) by mouth 2 (two) times daily before a meal.   vitamin C (ASCORBIC ACID) 500 MG tablet Take 500 mg by mouth daily.   ondansetron (ZOFRAN) 4 MG tablet Take 1 tablet (4 mg total) by mouth every 6 (six) hours as needed for nausea or vomiting.   No facility-administered encounter medications on file as of 09/30/2023.    Past  Medical History:  Diagnosis Date   GERD (gastroesophageal reflux disease)    History of kidney stones    Hypertension    Seasonal allergies    Sleep apnea     Past Surgical History:  Procedure Laterality Date   CHOLECYSTECTOMY  2016   cholecystitis   COLONOSCOPY N/A 08/27/2018   Procedure: COLONOSCOPY;  Surgeon: Malissa Hippo, MD;  Location: [facility];  Service: Endoscopy;  Laterality: N/A;  2:40    Family History  Problem Relation Age of Onset   Heart Problems Father    Heart Problems Paternal Grandfather    Colon cancer Neg Hx     Social History   Socioeconomic History   Marital status: Married    Spouse name: Not on file   Number of children: Not on file   Years of education: Not on file   Highest education level: Not on file  Occupational History   Not on file  Tobacco Use   Smoking status: Never   Smokeless tobacco: Never  Vaping Use   Vaping status: Never Used  Substance and Sexual Activity   Alcohol use: Never   Drug use: Never   Sexual activity: Not on file  Other Topics Concern   Not on file  Social History Narrative   Not on file   Social Drivers of Health   Financial Resource Strain: Not on file  Food Insecurity: Not on file  Transportation Needs: Not on file  Physical Activity: Not on  file  Stress: Not on file  Social Connections: Not on file  Intimate Partner Violence: Not on file   Review of Systems  Constitutional:  Negative for chills and fever.  HENT:  Negative for sore throat.   Respiratory:  Negative for cough and shortness of breath.   Cardiovascular:  Negative for chest pain, palpitations and leg swelling.  Gastrointestinal:  Negative for abdominal pain, blood in stool, constipation, diarrhea, nausea and vomiting.  Genitourinary:  Negative for dysuria and hematuria.       Erectile dysfunction  Musculoskeletal:  Negative for myalgias.  Skin:  Negative for itching and rash.  Neurological:  Negative for dizziness and  headaches.  Psychiatric/Behavioral:  Negative for depression and suicidal ideas.    Objective    BP 138/76   Pulse 84   Ht 5\' 10"  (1.778 m)   Wt 240 lb 3.2 oz (109 kg)   SpO2 96%   BMI 34.47 kg/m   Physical Exam Vitals reviewed.  Constitutional:      General: He is not in acute distress.    Appearance: Normal appearance. He is obese. He is not ill-appearing.  HENT:     Head: Normocephalic and atraumatic.     Right Ear: External ear normal.     Left Ear: External ear normal.     Nose: Nose normal. No congestion or rhinorrhea.     Mouth/Throat:     Mouth: Mucous membranes are moist.     Pharynx: Oropharynx is clear.  Eyes:     General: No scleral icterus.    Extraocular Movements: Extraocular movements intact.     Conjunctiva/sclera: Conjunctivae normal.     Pupils: Pupils are equal, round, and reactive to light.  Cardiovascular:     Rate and Rhythm: Normal rate and regular rhythm.     Pulses: Normal pulses.     Heart sounds: Normal heart sounds. No murmur heard. Pulmonary:     Effort: Pulmonary effort is normal.     Breath sounds: Normal breath sounds. No wheezing, rhonchi or rales.  Abdominal:     General: Abdomen is flat. Bowel sounds are normal. There is no distension.     Palpations: Abdomen is soft.     Tenderness: There is no abdominal tenderness.  Musculoskeletal:        General: No swelling or deformity. Normal range of motion.     Cervical back: Normal range of motion.  Skin:    General: Skin is warm and dry.     Capillary Refill: Capillary refill takes less than 2 seconds.  Neurological:     General: No focal deficit present.     Mental Status: He is alert and oriented to person, place, and time.     Motor: No weakness.  Psychiatric:        Mood and Affect: Mood normal.        Behavior: Behavior normal.        Thought Content: Thought content normal.    Assessment & Plan:   Problem List Items Addressed This Visit       Hypertension   He is  currently prescribed lisinopril 20 mg daily.  No medication changes are indicated today.      GERD (gastroesophageal reflux disease)   Chronic GERD.  Recently evaluated by GI and underwent EGD revealing linear antral ulceration.  Omeprazole increased to 40 mg twice daily.  Asymptomatic currently.      Erectile dysfunction   His acute concern today is erectile  dysfunction.  He describes difficulty both achieving and maintaining erections for the last year.  Testosterone levels checked by his previous PCP reportedly within normal limits.  He is interested in as needed medication for symptom relief.  Will update basic labs today and pending results prescribe PDE 5 inhibitor for as needed use.  No contraindications noted.      Return in about 6 months (around 04/01/2024) for CPE.   Billie Lade, MD

## 2023-09-30 NOTE — Assessment & Plan Note (Signed)
 Chronic GERD.  Recently evaluated by GI and underwent EGD revealing linear antral ulceration.  Omeprazole increased to 40 mg twice daily.  Asymptomatic currently.

## 2023-10-01 ENCOUNTER — Other Ambulatory Visit: Payer: Self-pay | Admitting: Internal Medicine

## 2023-10-01 ENCOUNTER — Encounter: Payer: Self-pay | Admitting: Internal Medicine

## 2023-10-01 MED ORDER — SILDENAFIL CITRATE 50 MG PO TABS: 50.0000 mg | ORAL_TABLET | Freq: Every day | ORAL | 0 refills | Status: DC | PRN

## 2023-10-02 LAB — CBC WITH DIFFERENTIAL/PLATELET
Basophils Absolute: 0.1 10*3/uL (ref 0.0–0.2)
Basos: 1 %
EOS (ABSOLUTE): 0.1 10*3/uL (ref 0.0–0.4)
Eos: 1 %
Hematocrit: 49.3 % (ref 37.5–51.0)
Hemoglobin: 16.2 g/dL (ref 13.0–17.7)
Immature Grans (Abs): 0 10*3/uL (ref 0.0–0.1)
Immature Granulocytes: 0 %
Lymphocytes Absolute: 1.8 10*3/uL (ref 0.7–3.1)
Lymphs: 28 %
MCH: 31.1 pg (ref 26.6–33.0)
MCHC: 32.9 g/dL (ref 31.5–35.7)
MCV: 95 fL (ref 79–97)
Monocytes Absolute: 0.6 10*3/uL (ref 0.1–0.9)
Monocytes: 9 %
Neutrophils Absolute: 3.9 10*3/uL (ref 1.4–7.0)
Neutrophils: 61 %
Platelets: 256 10*3/uL (ref 150–450)
RBC: 5.21 x10E6/uL (ref 4.14–5.80)
RDW: 11.8 % (ref 11.6–15.4)
WBC: 6.5 10*3/uL (ref 3.4–10.8)

## 2023-10-02 LAB — HCV INTERPRETATION

## 2023-10-02 LAB — LIPID PANEL
Chol/HDL Ratio: 4.7 ratio (ref 0.0–5.0)
Cholesterol, Total: 170 mg/dL (ref 100–199)
HDL: 36 mg/dL — ABNORMAL LOW (ref >39)
LDL Chol Calc (NIH): 106 mg/dL — ABNORMAL HIGH (ref 0–99)
Triglycerides: 157 mg/dL — ABNORMAL HIGH (ref 0–149)
VLDL Cholesterol Cal: 28 mg/dL (ref 5–40)

## 2023-10-02 LAB — CMP14+EGFR
ALT: 40 IU/L (ref 0–44)
AST: 26 IU/L (ref 0–40)
Albumin: 4.4 g/dL (ref 4.1–5.1)
Alkaline Phosphatase: 119 IU/L (ref 44–121)
BUN/Creatinine Ratio: 16 (ref 9–20)
BUN: 15 mg/dL (ref 6–24)
Bilirubin Total: 0.3 mg/dL (ref 0.0–1.2)
CO2: 24 mmol/L (ref 20–29)
Calcium: 9.7 mg/dL (ref 8.7–10.2)
Chloride: 104 mmol/L (ref 96–106)
Creatinine, Ser: 0.94 mg/dL (ref 0.76–1.27)
Globulin, Total: 2.5 g/dL (ref 1.5–4.5)
Glucose: 100 mg/dL — ABNORMAL HIGH (ref 70–99)
Potassium: 4.8 mmol/L (ref 3.5–5.2)
Sodium: 139 mmol/L (ref 134–144)
Total Protein: 6.9 g/dL (ref 6.0–8.5)
eGFR: 101 mL/min/{1.73_m2} (ref >59)

## 2023-10-02 LAB — B12 AND FOLATE PANEL
Folate: 8.3 ng/mL (ref >3.0)
Vitamin B-12: 562 pg/mL (ref 232–1245)

## 2023-10-02 LAB — TSH+FREE T4
Free T4: 1.07 ng/dL (ref 0.82–1.77)
TSH: 2.05 u[IU]/mL (ref 0.450–4.500)

## 2023-10-02 LAB — VITAMIN D 25 HYDROXY (VIT D DEFICIENCY, FRACTURES): Vit D, 25-Hydroxy: 38.8 ng/mL (ref 30.0–100.0)

## 2023-10-02 LAB — HEMOGLOBIN A1C
Est. average glucose Bld gHb Est-mCnc: 126 mg/dL
Hgb A1c MFr Bld: 6 % — ABNORMAL HIGH (ref 4.8–5.6)

## 2023-10-02 LAB — HIV ANTIBODY (ROUTINE TESTING W REFLEX)

## 2023-10-02 LAB — HCV AB W REFLEX TO QUANT PCR: HCV Ab: NONREACTIVE

## 2023-10-02 LAB — PSA: Prostate Specific Ag, Serum: 0.5 ng/mL (ref 0.0–4.0)

## 2023-10-22 ENCOUNTER — Other Ambulatory Visit: Payer: Self-pay | Admitting: Internal Medicine

## 2023-10-22 MED ORDER — SILDENAFIL CITRATE 50 MG PO TABS: 50.0000 mg | ORAL_TABLET | Freq: Every day | ORAL | 0 refills | Status: AC | PRN

## 2023-10-22 NOTE — Telephone Encounter (Signed)
 Copied from CRM [id number]. Topic: Clinical - Prescription Issue >> Oct 22, 2023  2:15 PM Marland Kitchen D wrote: Patient never got his prescription October 01, 2023 for -  sildenafil (VIAGRA) 50 MG tablet  Call back Tresa Endo is patient's wife- [phone]

## 2023-11-28 ENCOUNTER — Encounter: Payer: Self-pay | Admitting: Gastroenterology

## 2024-04-02 ENCOUNTER — Encounter: Admitting: Internal Medicine

## 2024-04-07 NOTE — Telephone Encounter (Signed)
 Not sure who he spoke with, but do you have any suggestions?

## 2024-04-09 NOTE — Telephone Encounter (Signed)
 Brendan Armstrong, last seen in 07/2023. Please use one of my urgent spots. Thanks.

## 2024-04-28 ENCOUNTER — Telehealth: Payer: Self-pay

## 2024-04-28 ENCOUNTER — Other Ambulatory Visit: Payer: Self-pay

## 2024-04-28 MED ORDER — LISINOPRIL 20 MG PO TABS: 20.0000 mg | ORAL_TABLET | Freq: Every day | ORAL | 0 refills | Status: DC

## 2024-04-28 NOTE — Telephone Encounter (Signed)
 Sent 30 days , patient needs appt

## 2024-04-28 NOTE — Telephone Encounter (Signed)
 Copied from CRM #[id number]. Topic: Clinical - Prescription Issue >> Apr 28, 2024 10:18 AM Cleave MATSU wrote: Reason for CRM: dave from [facility] pharmacy called to get lisinopril  20 mg sent over to them please.

## 2024-04-29 ENCOUNTER — Ambulatory Visit (INDEPENDENT_AMBULATORY_CARE_PROVIDER_SITE_OTHER): Admitting: Gastroenterology

## 2024-04-29 ENCOUNTER — Encounter: Payer: Self-pay | Admitting: Gastroenterology

## 2024-04-29 VITALS — BP 125/83 | HR 76 | Temp 98.0°F | Ht 70.0 in | Wt 209.0 lb

## 2024-04-29 DIAGNOSIS — K259 Gastric ulcer, unspecified as acute or chronic, without hemorrhage or perforation: Secondary | ICD-10-CM

## 2024-04-29 DIAGNOSIS — K580 Irritable bowel syndrome with diarrhea: Secondary | ICD-10-CM

## 2024-04-29 DIAGNOSIS — K589 Irritable bowel syndrome without diarrhea: Secondary | ICD-10-CM | POA: Insufficient documentation

## 2024-04-29 DIAGNOSIS — K219 Gastro-esophageal reflux disease without esophagitis: Secondary | ICD-10-CM

## 2024-04-29 MED ORDER — DICYCLOMINE HCL 10 MG PO CAPS: ORAL_CAPSULE | ORAL | 2 refills | Status: AC

## 2024-04-29 NOTE — Patient Instructions (Addendum)
 Try cutting back on omeprazole  to once daily before breakfast. Monitor for any abdominal discomfort, nausea, indigestion, reflux. If recurrence of symptoms, please increase back to twice a day.  I have sent in prescription for dicyclomine  for episodes of abdominal cramping, loose stools. You can take 1-2 capsules up to four times per day as needed during flares.   I will let you know if Dr. Shaaron is advising for upper endoscopy to verify ulcer healing.   Return office visit in one year.

## 2024-04-29 NOTE — Progress Notes (Signed)
 GI Office Note    Referring Provider: Melvenia Manus BRAVO, MD Primary Care Physician:  Melvenia Manus BRAVO, MD  Primary Gastroenterologist: Ozell Hollingshead, MD   Chief Complaint   Chief Complaint  Patient presents with   Follow-up    States that the nausea/vomiting has gotten significantly better.     History of Present Illness   Brendan Armstrong is a 48 y.o. male presenting today for foow up. Last seen in 07/2023. History of IBS-D, GERD. H/O early satiety, intermittent abdominal cramping followed by malodorous burps (but not like sulfur) lasting 6-8 hours, and then N/V. Completed EGD and colonoscopy since last ov, as outlined below.   Diarrhea has been worked up with negative stool studies for infection, inflammation, EPI. Celiac screen negative. Alpha Gal neg. ESR/CRP normal. Colonoscopy with random colon biopsies neg. TI could not be intubated due to ICV angulated down towards cecum. No evidence of IBD on CT in 06/2023.  On EGD, he was noted to have ulcerated antral mucosa likely due to NSAIDs. Negative for H.pylori.  Discussed the use of AI scribe software for clinical note transcription with the patient, who gave verbal consent to proceed.    He has experienced significant weight loss through intentional lifestyle changes, including increased physical activity and calorie counting. He started using a calorie counting app and increased his gym visits from four to six days a week. Initially weighing close to 260 pounds, he now weighs around 200 pounds. He maintains a calorie intake of about 2100 calories per day, increasing to 2400 calories on workout days. He feels better overall and notes improvements in his gastrointestinal symptoms since losing weight.  He has a history of gastrointestinal issues, including nausea, vomiting, and diarrhea, which he describes as 'flare-ups'. These episodes are often preceded by a 'charcoal taste' in his mouth. He has had only one flare up since March and  that was about a month ago, which lasted one to three days. He suspects a recent flare-up may have been triggered by a steroid pack prescribed for dental inflammation, also notes his dental pain was so severe he did have to take a couple of ibuprofen to get through it.  Otherwise he denies heartburn, vomiting, abdominal pain. BMs are normal. No melena, brbpr. He has continue to take omeprazole  40mg  BID since his EGD, at which time he was diagnosed with gastric ulcer. Per patient he was chewing up the ibuprofen for lower back pain. He stopped ibuprofen after his EGD in March with exception of recent use. He has since switched to Tylenol for pain management. He was previously diagnosed with irritable bowel syndrome but declined medication at that time. He is interested in having medication on hand for future flare-ups.    Wt Readings from Last 3 Encounters:  04/29/24 209 lb (94.8 kg)  09/30/23 240 lb 3.2 oz (109 kg)  09/27/23 240 lb (108.9 kg)    Prior Data   EGD 09/2023: -normal esophagus. Hyperplastic appearing polyps 1 snared and clipped.  -ulcerated antral mucosa s/p bx -increased omeprazole  to 40mg  BID.  -avoid all nsaids -polyp from stomach benign.  -biopsies from stomach with significant inflammation likely NSAID related. NEG for H.pylori  Colonoscopy 09/2023: -entire examined colon normal. Opening to ICV angulated down towards cecum, could not intubate -distal rectum and anal verge are normal -segmental biopsies, neg -next colonoscopy in 10 years.    07/2023: Fecal elastase >800   Fecal calprotectin 57 TTG IgA <2 IgA  204 Alpha  Gal: neg Sed rate 2 CRP <1  CT A/P with contrast 06/2023: IMPRESSION: 1. No acute abdominopelvic findings. 2. Geographic hypoattenuation of segment 4 of the liver which may represent focal fatty infiltration. 3. Colonic diverticulosis without acute diverticulitis. 4. Small fat-containing paraumbilical hernia. 5.  Aortic Atherosclerosis  (ICD10-I70.0).  Medications   Current Outpatient Medications  Medication Sig Dispense Refill   cetirizine (ZYRTEC) 10 MG tablet Take 10 mg by mouth daily.     cholecalciferol (VITAMIN D ) 25 MCG (1000 UT) tablet Take 1,000 Units by mouth daily.     lisinopril  (ZESTRIL ) 20 MG tablet Take 1 tablet (20 mg total) by mouth daily. 30 tablet 0   omeprazole  (PRILOSEC) 40 MG capsule Take 1 capsule (40 mg total) by mouth 2 (two) times daily before a meal. 60 capsule 11   ondansetron  (ZOFRAN ) 4 MG tablet Take 1 tablet (4 mg total) by mouth every 6 (six) hours as needed for nausea or vomiting. 20 tablet 0   sildenafil  (VIAGRA ) 50 MG tablet Take 1 tablet (50 mg total) by mouth daily as needed for erectile dysfunction. 10 tablet 0   vitamin C (ASCORBIC ACID) 500 MG tablet Take 500 mg by mouth daily.     No current facility-administered medications for this visit.    Allergies   Allergies as of 04/29/2024 - Review Complete 04/29/2024  Allergen Reaction Noted   Avelox [moxifloxacin hcl in nacl] Hives and Rash 06/25/2018      Review of Systems   General: Negative for anorexia, unintentional weight loss, fever, chills, fatigue, weakness. ENT: Negative for hoarseness, difficulty swallowing , nasal congestion. CV: Negative for chest pain, angina, palpitations, dyspnea on exertion, peripheral edema.  Respiratory: Negative for dyspnea at rest, dyspnea on exertion, cough, sputum, wheezing.  GI: See history of present illness. GU:  Negative for dysuria, hematuria, urinary incontinence, urinary frequency, nocturnal urination.  Endo: Negative for unusual weight change.     Physical Exam   BP 125/83 (BP Location: Right Arm, Patient Position: Sitting, Cuff Size: Large)   Pulse 76   Temp 98 F (36.7 C) (Oral)   Ht 5' 10 (1.778 m)   Wt 209 lb (94.8 kg)   SpO2 99%   BMI 29.99 kg/m    General: Well-nourished, well-developed in no acute distress.  Eyes: No icterus. Mouth: Oropharyngeal mucosa moist  and pink   Abdomen: Bowel sounds are normal, nontender, nondistended, no hepatosplenomegaly or masses,  no abdominal bruits or hernia , no rebound or guarding.  Rectal: not performed Extremities: No lower extremity edema. No clubbing or deformities. Neuro: Alert and oriented x 4   Skin: Warm and dry, no jaundice.   Psych: Alert and cooperative, normal mood and affect.  Labs   Lab Results  Component Value Date   NA 139 09/30/2023   CL 104 09/30/2023   K 4.8 09/30/2023   CO2 24 09/30/2023   BUN 15 09/30/2023   CREATININE 0.94 09/30/2023   EGFR 101 09/30/2023   CALCIUM 9.7 09/30/2023   ALBUMIN 4.4 09/30/2023   GLUCOSE 100 (H) 09/30/2023   Lab Results  Component Value Date   ALT 40 09/30/2023   AST 26 09/30/2023   ALKPHOS 119 09/30/2023   BILITOT 0.3 09/30/2023   Lab Results  Component Value Date   WBC 6.5 09/30/2023   HGB 16.2 09/30/2023   HCT 49.3 09/30/2023   MCV 95 09/30/2023   PLT 256 09/30/2023   Lab Results  Component Value Date   TSH 2.050 09/30/2023  Lab Results  Component Value Date   VITAMINB12 562 09/30/2023   Lab Results  Component Value Date   FOLATE 8.3 09/30/2023   Lab Results  Component Value Date   HGBA1C 6.0 (H) 09/30/2023    Imaging Studies   No results found.  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 Has been on omeprazole  40mg  BID since March, doing well except for recent flare up of N/V/D after steroid pack and taking a couple of ibuprofen in setting of severe dental pain. Otherwise patient has avoided all NSAIDS. - Reduce omeprazole  40mg  to once daily. - Monitor for reflux, heartburn, abd pain, indigestion, n/v. If recurrent symptoms, go back on BID dosing. - continue to avoid NSAIDs. - Consider lower dose or alternative administration for steroids if needed.  Irritable bowel syndrome-D Overall doing much better since significant lifestyle changes. He would like to have  medication on hand for future flare ups.  -dicyclomine  10-20mg  up to four times daily for abdominal pain, diarrhea.   Gastroesophageal reflux disease (GERD) GERD symptoms improved with weight loss, dietary changes, and increased omeprazole . - Reduce omeprazole  to once daily. - Monitor for GERD symptoms; return to twice-daily dosing if needed.      Sonny RAMAN. Ezzard, MHS, PA-C [facility]

## 2024-05-29 ENCOUNTER — Telehealth: Payer: Self-pay

## 2024-05-29 ENCOUNTER — Other Ambulatory Visit: Payer: Self-pay

## 2024-05-29 MED ORDER — LISINOPRIL 20 MG PO TABS: 20.0000 mg | ORAL_TABLET | Freq: Every day | ORAL | 2 refills | Status: AC

## 2024-05-29 NOTE — Telephone Encounter (Signed)
 Lisinopril  refilled.  Needs to schedule f/u appt

## 2024-05-29 NOTE — Telephone Encounter (Unsigned)
 Copied from CRM [id number]. Topic: Clinical - Medication Refill >> May 29, 2024  3:37 PM Harlene ORN wrote: Medication: lisinopril  (ZESTRIL ) 20 MG tablet  Has the patient contacted their pharmacy? Yes (Agent: If no, request that the patient contact the pharmacy for the refill. If patient does not wish to contact the pharmacy document the reason why and proceed with request.) (Agent: If yes, when and what did the pharmacy advise?)  This is the patient's preferred pharmacy:  [facility] [place], [place] - [address] [place] [address] Phone: [phone] Fax: [phone]  Is this the correct pharmacy for this prescription? Yes If no, delete pharmacy and type the correct one.   Has the prescription been filled recently? No  Is the patient out of the medication? Yes  Has the patient been seen for an appointment in the last year OR does the patient have an upcoming appointment? Yes  Can we respond through MyChart? Yes  Agent: Please be advised that Rx refills may take up to 3 business days. We ask that you follow-up with your pharmacy.

## 2024-06-02 ENCOUNTER — Other Ambulatory Visit: Payer: Self-pay

## 2024-09-09 ENCOUNTER — Ambulatory Visit: Admitting: Urology

## 2024-09-11 ENCOUNTER — Ambulatory Visit: Admitting: Urology
